# Patient Record
Sex: Female | Born: 1945 | Race: White | Hispanic: No | Marital: Single | Smoking: Former smoker
Health system: Southern US, Community
[De-identification: ages and names within clinical notes are randomized; demographics above are authoritative.]

## PROBLEM LIST (undated history)

## (undated) DIAGNOSIS — E785 Hyperlipidemia, unspecified: Secondary | ICD-10-CM

## (undated) DIAGNOSIS — E079 Disorder of thyroid, unspecified: Secondary | ICD-10-CM

## (undated) DIAGNOSIS — K219 Gastro-esophageal reflux disease without esophagitis: Secondary | ICD-10-CM

## (undated) DIAGNOSIS — I10 Essential (primary) hypertension: Secondary | ICD-10-CM

## (undated) DIAGNOSIS — B019 Varicella without complication: Secondary | ICD-10-CM

## (undated) DIAGNOSIS — J40 Bronchitis, not specified as acute or chronic: Secondary | ICD-10-CM

## (undated) DIAGNOSIS — C801 Malignant (primary) neoplasm, unspecified: Secondary | ICD-10-CM

## (undated) HISTORY — DX: Hyperlipidemia, unspecified: E78.5

## (undated) HISTORY — PX: BREAST SURGERY: SHX581

## (undated) HISTORY — DX: Varicella without complication: B01.9

## (undated) HISTORY — DX: Bronchitis, not specified as acute or chronic: J40

## (undated) HISTORY — DX: Gastro-esophageal reflux disease without esophagitis: K21.9

## (undated) HISTORY — DX: Disorder of thyroid, unspecified: E07.9

---

## 1973-06-16 HISTORY — PX: CHOLECYSTECTOMY: SHX55

## 1973-06-16 HISTORY — PX: APPENDECTOMY: SHX54

## 1987-06-17 HISTORY — PX: ABDOMINAL HYSTERECTOMY: SHX81

## 1999-10-24 ENCOUNTER — Encounter: Payer: Self-pay | Admitting: Emergency Medicine

## 1999-10-24 ENCOUNTER — Emergency Department (HOSPITAL_COMMUNITY): Admission: EM | Admit: 1999-10-24 | Discharge: 1999-10-24 | Payer: Self-pay | Admitting: Emergency Medicine

## 2002-01-10 ENCOUNTER — Emergency Department (HOSPITAL_COMMUNITY): Admission: EM | Admit: 2002-01-10 | Discharge: 2002-01-10 | Payer: Self-pay | Admitting: Emergency Medicine

## 2002-07-08 ENCOUNTER — Emergency Department (HOSPITAL_COMMUNITY): Admission: EM | Admit: 2002-07-08 | Discharge: 2002-07-08 | Payer: Self-pay | Admitting: Emergency Medicine

## 2003-06-17 HISTORY — PX: SHOULDER SURGERY: SHX246

## 2004-10-25 ENCOUNTER — Ambulatory Visit (HOSPITAL_COMMUNITY): Admission: RE | Admit: 2004-10-25 | Discharge: 2004-10-25 | Payer: Self-pay | Admitting: Orthopedic Surgery

## 2004-10-25 ENCOUNTER — Ambulatory Visit (HOSPITAL_BASED_OUTPATIENT_CLINIC_OR_DEPARTMENT_OTHER): Admission: RE | Admit: 2004-10-25 | Discharge: 2004-10-25 | Payer: Self-pay | Admitting: Orthopedic Surgery

## 2004-12-13 ENCOUNTER — Emergency Department (HOSPITAL_COMMUNITY): Admission: EM | Admit: 2004-12-13 | Discharge: 2004-12-13 | Payer: Self-pay | Admitting: Emergency Medicine

## 2009-12-18 ENCOUNTER — Emergency Department (HOSPITAL_COMMUNITY): Admission: EM | Admit: 2009-12-18 | Discharge: 2009-12-18 | Payer: Self-pay | Admitting: Emergency Medicine

## 2010-04-22 ENCOUNTER — Emergency Department (HOSPITAL_COMMUNITY): Admission: EM | Admit: 2010-04-22 | Discharge: 2010-04-22 | Payer: Self-pay | Admitting: Emergency Medicine

## 2010-11-01 NOTE — Op Note (Signed)
NAMEVEDIKA, DUMLAO             ACCOUNT NO.:  [id number]   MEDICAL RECORD NO.:  [id number]          PATIENT TYPE:  AMB   LOCATION:  DSC                          FACILITY:  [facility]   PHYSICIAN:  Harvie Junior, M.D.   DATE OF BIRTH:  August 02, 1945   DATE OF PROCEDURE:  10/25/2004  DATE OF DISCHARGE:                                 OPERATIVE REPORT   PREOPERATIVE DIAGNOSES:  1.  Impingement.  2.  Acromioclavicular joint arthritis.   POSTOPERATIVE DIAGNOSES:  1.  Impingement.  2.  Acromioclavicular joint arthritis.  3.  Partial thickness rotator cuff tear undersurface.  4.  Posterior superior labral tear.   OPERATION PERFORMED:  1.  Anterolateral acromioplasty from a lateral and posterior compartment.  2.  Distal clavicle resection from the anterior compartment.  3.  Debridement of partial thickness, undersurface rotator cuff tear and      debridement of posterior superior labral tear within the glenohumeral      joint.   SURGEON:  Harvie Junior, M.D.   ASSISTANT:  Marshia Ly, P.A.   ANESTHESIA:  General.   INDICATIONS FOR PROCEDURE:  Ms. Cheryl Gray is a 65 year old female with a  long history of having left shoulder pain.  She was treated conservatively  for a prolonged period of time with continued complaints of pain and failure  of conservative care.  The patient was ultimately taken to the operating  room for operative arthroscopy.   DESCRIPTION OF PROCEDURE:  The patient was taken to the operating room.  After adequate anesthesia was obtained with general anesthetic, the patient  was placed supine on the operating table in moved into beach chair position.  All bony prominences were well padded.  Attention was then turned to the  left shoulder where routine arthroscopic examination revealed that there was  an obvious partial thickness, rotator cuff tear on the undersurface.  This  was debrided with suction shaver.  This was probed at length.  Did not  appear to be  full thickness of any area.  Following this, attention was  turned to the glenohumeral joint.  The biceps tendon was within normal  limits.  The subscap was within normal limits.  Attention was turned back to  the posterior superior labrum which was debrided.  Following this, attention  was turned back to the rotator cuff to make sure there was no additional  partial thickness injury.  Seeing none, the cannulas were taken out of the  glenohumeral joint into the subacromial space.  Anterolateral acromioplasty  was then performed from both the lateral and posterior compartment and  following this, a distal clavicle resection was performed over 15 mm from  the anterior compartment.  Following this, the rotator cuff was thoroughly  debrided from the superior surface, no evidence of a full thickness tearing.  The thinned area from the undersurface was probed and did not appear to have  any significant full thickness issues.  At that point, the shoulder was  copiously irrigated and suctioned dry.  The arthroscopic portals were closed  with a bandage, sterile compressive dressing was applied.  The  patient was  taken to the recovery room where she was noted to be in satisfactory  condition.   The estimated blood loss for this procedure was none.       JLG/MEDQ  D:  10/25/2004  T:  10/25/2004  Job:  [id number]

## 2012-02-16 ENCOUNTER — Emergency Department (HOSPITAL_BASED_OUTPATIENT_CLINIC_OR_DEPARTMENT_OTHER): Payer: Self-pay

## 2012-02-16 ENCOUNTER — Emergency Department (HOSPITAL_BASED_OUTPATIENT_CLINIC_OR_DEPARTMENT_OTHER)
Admission: EM | Admit: 2012-02-16 | Discharge: 2012-02-16 | Disposition: A | Discharge status: Home / Self Care | Payer: Self-pay | Attending: Emergency Medicine | Admitting: Emergency Medicine

## 2012-02-16 ENCOUNTER — Encounter (HOSPITAL_BASED_OUTPATIENT_CLINIC_OR_DEPARTMENT_OTHER): Payer: Self-pay | Admitting: *Deleted

## 2012-02-16 DIAGNOSIS — I10 Essential (primary) hypertension: Secondary | ICD-10-CM | POA: Insufficient documentation

## 2012-02-16 DIAGNOSIS — W2209XA Striking against other stationary object, initial encounter: Secondary | ICD-10-CM | POA: Insufficient documentation

## 2012-02-16 DIAGNOSIS — S60222A Contusion of left hand, initial encounter: Secondary | ICD-10-CM

## 2012-02-16 DIAGNOSIS — Z885 Allergy status to narcotic agent status: Secondary | ICD-10-CM | POA: Insufficient documentation

## 2012-02-16 DIAGNOSIS — S60229A Contusion of unspecified hand, initial encounter: Secondary | ICD-10-CM | POA: Insufficient documentation

## 2012-02-16 DIAGNOSIS — Z87891 Personal history of nicotine dependence: Secondary | ICD-10-CM | POA: Insufficient documentation

## 2012-02-16 HISTORY — DX: Essential (primary) hypertension: I10

## 2012-02-16 NOTE — ED Notes (Signed)
Hit her left hand on a counter this am. Bruising and swelling noted to his left 5th digit.

## 2012-02-16 NOTE — ED Notes (Signed)
Pt. Is able to move L pinky and ring finger after hitting the L hand on a wood counter today at 11am.  Pt. In no distress and is able to bend hand and fingers on Left.

## 2012-02-16 NOTE — ED Provider Notes (Signed)
History     CSN: [id number]  Arrival date & time 02/16/12  1257   First MD Initiated Contact with Patient 02/16/12 1443      Chief Complaint  Patient presents with  . Hand Pain    (Consider location/radiation/quality/duration/timing/severity/associated sxs/prior treatment) HPI This 66 year old female accidentally hit Her house with her left hand earlier today in the morning and has bruising and swelling to the left fifth metacarpophalangeal region. Skin is intact. She has full range of motion is able to fully extend and make a fist with no weakness or numbness to that hand. There is no pain to the rest of the hand and no pain to the wrist. There is no treatment prior to arrival other than some ice pack at home. She is no other injuries no other concerns. This is not an episode of domestic violence. She is no neck pain back pain chest pain shortness breath abdominal pain or injury to her right arm or legs. Past Medical History  Diagnosis Date  . Hypertension     Past Surgical History  Procedure Date  . Abdominal hysterectomy   . Appendectomy   . Cholecystectomy     No family history on file.  History  Substance Use Topics  . Smoking status: Former Games developer  . Smokeless tobacco: Not on file  . Alcohol Use: No    OB History    Grav Para Term Preterm Abortions TAB SAB Ect Mult Living                  Review of Systems 10 Systems reviewed and are negative for acute change except as noted in the HPI. Allergies  Codeine  Home Medications  No current outpatient prescriptions on file.  BP 203/114  Pulse 85  Temp 98.8 F (37.1 C) (Oral)  Resp 18  SpO2 99%  Physical Exam  Nursing note and vitals reviewed. Constitutional:       Awake, alert, nontoxic appearance.  HENT:  Head: Atraumatic.  Eyes: Right eye exhibits no discharge. Left eye exhibits no discharge.  Neck: Neck supple.  Pulmonary/Chest: Effort normal. She exhibits no tenderness.  Abdominal: Soft. There is  no tenderness. There is no rebound.  Musculoskeletal: She exhibits no tenderness.       Baseline ROM, no obvious new focal weakness. Right arm and both legs nontender. Cervical spine nontender. Left arm is nontender the shoulder upper arm elbow forearm and wrist. Left hand is tender only at the bruised and swollen fifth metacarpal phalangeal region only. She has full active movement of her left hand without difficulty. She is normal light touch and no apparent weakness in her left hand. The rest of her hand is nontender.  Neurological:       Mental status and motor strength appears baseline for patient and situation.  Skin: No rash noted.  Psychiatric: She has a normal mood and affect.    ED Course  Procedures (including critical care time)  Labs Reviewed - No data to display No results found.   1. Contusion of left hand       MDM          Hurman Horn, MD 02/20/12 2227

## 2012-06-20 ENCOUNTER — Emergency Department (HOSPITAL_COMMUNITY)
Admission: EM | Admit: 2012-06-20 | Discharge: 2012-06-20 | Disposition: A | Discharge status: Home / Self Care | Payer: Self-pay | Attending: Emergency Medicine | Admitting: Emergency Medicine

## 2012-06-20 ENCOUNTER — Encounter (HOSPITAL_COMMUNITY): Payer: Self-pay

## 2012-06-20 DIAGNOSIS — R7309 Other abnormal glucose: Secondary | ICD-10-CM | POA: Insufficient documentation

## 2012-06-20 DIAGNOSIS — R112 Nausea with vomiting, unspecified: Secondary | ICD-10-CM | POA: Insufficient documentation

## 2012-06-20 DIAGNOSIS — R6883 Chills (without fever): Secondary | ICD-10-CM | POA: Insufficient documentation

## 2012-06-20 DIAGNOSIS — R739 Hyperglycemia, unspecified: Secondary | ICD-10-CM

## 2012-06-20 DIAGNOSIS — B9789 Other viral agents as the cause of diseases classified elsewhere: Secondary | ICD-10-CM | POA: Insufficient documentation

## 2012-06-20 DIAGNOSIS — R51 Headache: Secondary | ICD-10-CM | POA: Insufficient documentation

## 2012-06-20 DIAGNOSIS — I10 Essential (primary) hypertension: Secondary | ICD-10-CM | POA: Insufficient documentation

## 2012-06-20 DIAGNOSIS — R61 Generalized hyperhidrosis: Secondary | ICD-10-CM | POA: Insufficient documentation

## 2012-06-20 DIAGNOSIS — Z87891 Personal history of nicotine dependence: Secondary | ICD-10-CM | POA: Insufficient documentation

## 2012-06-20 DIAGNOSIS — B349 Viral infection, unspecified: Secondary | ICD-10-CM

## 2012-06-20 DIAGNOSIS — R630 Anorexia: Secondary | ICD-10-CM | POA: Insufficient documentation

## 2012-06-20 LAB — URINALYSIS, ROUTINE W REFLEX MICROSCOPIC
Glucose, UA: NEGATIVE mg/dL
Hgb urine dipstick: NEGATIVE
Ketones, ur: NEGATIVE mg/dL
Leukocytes, UA: NEGATIVE
pH: 5 (ref 5.0–8.0)

## 2012-06-20 LAB — CBC WITH DIFFERENTIAL/PLATELET
Basophils Absolute: 0 10*3/uL (ref 0.0–0.1)
Lymphocytes Relative: 3 % — ABNORMAL LOW (ref 12–46)
Monocytes Relative: 3 % (ref 3–12)
Platelets: 326 10*3/uL (ref 150–400)
RBC: 4.72 MIL/uL (ref 3.87–5.11)
RDW: 12.7 % (ref 11.5–15.5)
WBC: 13 10*3/uL — ABNORMAL HIGH (ref 4.0–10.5)

## 2012-06-20 LAB — COMPREHENSIVE METABOLIC PANEL
ALT: 15 U/L (ref 0–35)
AST: 13 U/L (ref 0–37)
Albumin: 3.8 g/dL (ref 3.5–5.2)
Alkaline Phosphatase: 74 U/L (ref 39–117)
Chloride: 94 mEq/L — ABNORMAL LOW (ref 96–112)
Potassium: 4.3 mEq/L (ref 3.5–5.1)
Sodium: 132 mEq/L — ABNORMAL LOW (ref 135–145)
Total Bilirubin: 0.5 mg/dL (ref 0.3–1.2)

## 2012-06-20 MED ORDER — METOCLOPRAMIDE HCL 5 MG/ML IJ SOLN
10.0000 mg | Freq: Once | INTRAMUSCULAR | Status: AC
  Administered 2012-06-20: 10 mg via INTRAVENOUS
  Filled 2012-06-20: qty 2

## 2012-06-20 MED ORDER — SODIUM CHLORIDE 0.9 % IV BOLUS (SEPSIS)
1000.0000 mL | Freq: Once | INTRAVENOUS | Status: AC
  Administered 2012-06-20: 1000 mL via INTRAVENOUS

## 2012-06-20 MED ORDER — ONDANSETRON HCL 4 MG/2ML IJ SOLN
4.0000 mg | Freq: Once | INTRAMUSCULAR | Status: AC
  Administered 2012-06-20: 4 mg via INTRAVENOUS
  Filled 2012-06-20: qty 2

## 2012-06-20 MED ORDER — LORAZEPAM 1 MG PO TABS
1.0000 mg | ORAL_TABLET | Freq: Once | ORAL | Status: AC
  Administered 2012-06-20: 1 mg via ORAL
  Filled 2012-06-20: qty 1

## 2012-06-20 MED ORDER — ONDANSETRON 8 MG PO TBDP: ORAL_TABLET | ORAL | Status: DC

## 2012-06-20 NOTE — ED Provider Notes (Signed)
Medical screening examination/treatment/procedure(s) were performed by non-physician practitioner and as supervising physician I was immediately available for consultation/collaboration.  Maudell Stanbrough M Mystique Bjelland, MD 06/20/12 0703 

## 2012-06-20 NOTE — ED Notes (Signed)
Per pt started having abdominal pain, n/v, and abdominal bloating today.  Pt vomiting bile in triage.  Pt states small bm this AM.

## 2012-06-20 NOTE — ED Provider Notes (Signed)
History     CSN: [id number]  Arrival date & time 06/20/12  0002   First MD Initiated Contact with Patient 06/20/12 [id number]      Chief Complaint  Patient presents with  . Emesis  . Headache   HPI  History provided by the patient and family. Patient is a 67 year old female with history of hypertension, cholecystectomy, appendectomy who presents with complaints of nausea vomiting. Patient reports feeling slightly ill today but otherwise generally well until about 6 PM when she developed multiple episodes of nausea vomiting. Patient reports continued nausea vomiting especially after attempting to eat and drink. She has some associated upper abdominal cramping which is improved after vomiting. Symptoms have also been associated with some chills and sweats. She denies any fever. Denies any diarrhea constipation symptoms. Denies any abdominal distention. Patient has had some family members with recent diarrhea symptoms. She denies any chest pain, shortness of breath or heart palpitations.    Past Medical History  Diagnosis Date  . Hypertension     Past Surgical History  Procedure Date  . Abdominal hysterectomy   . Appendectomy   . Cholecystectomy     History reviewed. No pertinent family history.  History  Substance Use Topics  . Smoking status: Former Games developer  . Smokeless tobacco: Not on file  . Alcohol Use: No    OB History    Grav Para Term Preterm Abortions TAB SAB Ect Mult Living                  Review of Systems  Constitutional: Positive for chills, diaphoresis and appetite change. Negative for fever.  HENT: Negative for neck pain and neck stiffness.   Respiratory: Negative for cough.   Gastrointestinal: Positive for nausea and vomiting. Negative for abdominal pain, diarrhea and constipation.  Neurological: Positive for headaches.  All other systems reviewed and are negative.    Allergies  Codeine  Home Medications  No current outpatient prescriptions on  file.  BP 130/82  Pulse 114  Temp 98.4 F (36.9 C) (Oral)  Resp 16  SpO2 97%  Physical Exam  Nursing note and vitals reviewed. Constitutional: She is oriented to person, place, and time. She appears well-developed and well-nourished. No distress.  HENT:  Head: Normocephalic.  Cardiovascular: Normal rate and regular rhythm.   Pulmonary/Chest: Effort normal and breath sounds normal. No respiratory distress. She has no wheezes.  Abdominal: She exhibits no distension. There is tenderness. There is no rebound and no guarding.       Mild epigastric tenderness  Neurological: She is alert and oriented to person, place, and time.  Skin: Skin is warm and dry.  Psychiatric: She has a normal mood and affect. Her behavior is normal.    ED Course  Procedures   Results for orders placed during the hospital encounter of 06/20/12  CBC WITH DIFFERENTIAL      Component Value Range   WBC 13.0 (*) 4.0 - 10.5 K/uL   RBC 4.72  3.87 - 5.11 MIL/uL   Hemoglobin 14.8  12.0 - 15.0 g/dL   HCT 11.9  14.7 - 82.9 %   MCV 90.5  78.0 - 100.0 fL   MCH 31.4  26.0 - 34.0 pg   MCHC 34.7  30.0 - 36.0 g/dL   RDW 56.2  13.0 - 86.5 %   Platelets 326  150 - 400 K/uL   Neutrophils Relative 93 (*) 43 - 77 %   Lymphocytes Relative 3 (*) 12 - 46 %  Monocytes Relative 3  3 - 12 %   Eosinophils Relative 1  0 - 5 %   Basophils Relative 0  0 - 1 %   Neutro Abs 12.1 (*) 1.7 - 7.7 K/uL   Lymphs Abs 0.4 (*) 0.7 - 4.0 K/uL   Monocytes Absolute 0.4  0.1 - 1.0 K/uL   Eosinophils Absolute 0.1  0.0 - 0.7 K/uL   Basophils Absolute 0.0  0.0 - 0.1 K/uL   WBC Morphology VACUOLATED NEUTROPHILS    COMPREHENSIVE METABOLIC PANEL      Component Value Range   Sodium 132 (*) 135 - 145 mEq/L   Potassium 4.3  3.5 - 5.1 mEq/L   Chloride 94 (*) 96 - 112 mEq/L   CO2 23  19 - 32 mEq/L   Glucose, Bld 240 (*) 70 - 99 mg/dL   BUN 23  6 - 23 mg/dL   Creatinine, Ser 1.61  0.50 - 1.10 mg/dL   Calcium 9.2  8.4 - 09.6 mg/dL   Total Protein  8.0  6.0 - 8.3 g/dL   Albumin 3.8  3.5 - 5.2 g/dL   AST 13  0 - 37 U/L   ALT 15  0 - 35 U/L   Alkaline Phosphatase 74  39 - 117 U/L   Total Bilirubin 0.5  0.3 - 1.2 mg/dL   GFR calc non Af Amer 90 (*) >90 mL/min   GFR calc Af Amer >90  >90 mL/min  LIPASE, BLOOD      Component Value Range   Lipase 20  11 - 59 U/L  URINALYSIS, ROUTINE W REFLEX MICROSCOPIC      Component Value Range   Color, Urine AMBER (*) YELLOW   APPearance CLOUDY (*) CLEAR   Specific Gravity, Urine 1.028  1.005 - 1.030   pH 5.0  5.0 - 8.0   Glucose, UA NEGATIVE  NEGATIVE mg/dL   Hgb urine dipstick NEGATIVE  NEGATIVE   Bilirubin Urine SMALL (*) NEGATIVE   Ketones, ur NEGATIVE  NEGATIVE mg/dL   Protein, ur NEGATIVE  NEGATIVE mg/dL   Urobilinogen, UA 0.2  0.0 - 1.0 mg/dL   Nitrite NEGATIVE  NEGATIVE   Leukocytes, UA NEGATIVE  NEGATIVE        1. Nausea & vomiting   2. Viral infection   3. Hyperglycemia       MDM  1:00 AM patient seen and evaluated. Patient sitting comfortably complaining of some nausea. Does not appear in any acute distress.  Patient feeling much better after IV fluids and medications. She is tolerating by mouth fluids. Abdomen remains soft and benign without significant tenderness or peritoneal signs. Patient requesting to return home. We'll provide prescription for Zofran.      Angus Seller, PA 06/20/12 0600

## 2013-01-24 ENCOUNTER — Other Ambulatory Visit (INDEPENDENT_AMBULATORY_CARE_PROVIDER_SITE_OTHER): Payer: Medicare Other

## 2013-01-24 ENCOUNTER — Ambulatory Visit (INDEPENDENT_AMBULATORY_CARE_PROVIDER_SITE_OTHER): Payer: Medicare Other | Admitting: Internal Medicine

## 2013-01-24 ENCOUNTER — Encounter: Payer: Self-pay | Admitting: Internal Medicine

## 2013-01-24 VITALS — BP 170/100 | HR 75 | Temp 97.5°F | Ht 60.0 in | Wt 199.0 lb

## 2013-01-24 DIAGNOSIS — E079 Disorder of thyroid, unspecified: Secondary | ICD-10-CM

## 2013-01-24 DIAGNOSIS — R7309 Other abnormal glucose: Secondary | ICD-10-CM

## 2013-01-24 DIAGNOSIS — I10 Essential (primary) hypertension: Secondary | ICD-10-CM

## 2013-01-24 DIAGNOSIS — Z23 Encounter for immunization: Secondary | ICD-10-CM

## 2013-01-24 DIAGNOSIS — E669 Obesity, unspecified: Secondary | ICD-10-CM | POA: Insufficient documentation

## 2013-01-24 DIAGNOSIS — K219 Gastro-esophageal reflux disease without esophagitis: Secondary | ICD-10-CM

## 2013-01-24 DIAGNOSIS — E785 Hyperlipidemia, unspecified: Secondary | ICD-10-CM

## 2013-01-24 DIAGNOSIS — R739 Hyperglycemia, unspecified: Secondary | ICD-10-CM

## 2013-01-24 LAB — COMPREHENSIVE METABOLIC PANEL
AST: 15 U/L (ref 0–37)
BUN: 15 mg/dL (ref 6–23)
Calcium: 9.8 mg/dL (ref 8.4–10.5)
Chloride: 101 mEq/L (ref 96–112)
Creatinine, Ser: 0.8 mg/dL (ref 0.4–1.2)
GFR: 81.16 mL/min (ref >60.00)

## 2013-01-24 LAB — LIPID PANEL
HDL: 54.3 mg/dL (ref >39.00)
Triglycerides: 131 mg/dL (ref 0.0–149.0)

## 2013-01-24 LAB — CBC
Hemoglobin: 14.4 g/dL (ref 12.0–15.0)
MCHC: 33.4 g/dL (ref 30.0–36.0)
Platelets: 323 10*3/uL (ref 150.0–400.0)

## 2013-01-24 LAB — TSH: TSH: 4.39 u[IU]/mL (ref 0.35–5.50)

## 2013-01-24 MED ORDER — PNEUMOCOCCAL VAC POLYVALENT 25 MCG/0.5ML IJ INJ
0.5000 mL | INJECTION | Freq: Once | INTRAMUSCULAR | Status: AC
  Administered 2013-01-24: 0.5 mL via INTRAMUSCULAR

## 2013-01-24 MED ORDER — LISINOPRIL-HYDROCHLOROTHIAZIDE 20-12.5 MG PO TABS: 1.0000 | ORAL_TABLET | Freq: Every day | ORAL | Status: DC

## 2013-01-24 MED ORDER — PANTOPRAZOLE SODIUM 40 MG PO TBEC: 40.0000 mg | DELAYED_RELEASE_TABLET | Freq: Every day | ORAL | Status: DC

## 2013-01-24 MED ORDER — TETANUS-DIPHTHERIA TOXOIDS TD 5-2 LFU IM INJ
0.5000 mL | INJECTION | Freq: Once | INTRAMUSCULAR | Status: AC
  Administered 2013-01-24: 0.5 mL via INTRAMUSCULAR

## 2013-01-24 NOTE — Progress Notes (Signed)
HPI Pt presents to the clinic today to establish care. She is transferring care from [facility]. She has no concerns today.  Flu: 2013 Tetanus: 2004 Pneumovax: never Zostavax: never Colonoscopy: around the age of 89 Pap smear: total hysterectomy Mammogram: more than 5 years ago Bone Density: never Eye doctor: as needed Dentist: as needed  Past Medical History  Diagnosis Date  . Hypertension   . Chicken pox   . Bronchitis   . GERD (gastroesophageal reflux disease)   . Thyroid condition   . Hyperlipidemia     No current outpatient prescriptions on file.   No current facility-administered medications for this visit.    Allergies  Allergen Reactions  . Codeine     hallucinations.    Family History  Problem Relation Age of Onset  . Hyperlipidemia Mother   . Hypertension Mother   . Cancer Father     lung  . Hyperlipidemia Father   . Hypertension Father     History   Social History  . Marital Status: Single    Spouse Name: N/A    Number of Children: 3   . Years of Education: 9   Occupational History  . Retired    Social History Main Topics  . Smoking status: Former Games developer  . Smokeless tobacco: Not on file  . Alcohol Use: No  . Drug Use: No  . Sexually Active: Not Currently   Other Topics Concern  . Not on file   Social History Narrative  . No narrative on file    ROS:  Constitutional: Denies fever, malaise, fatigue, headache or abrupt weight changes.  HEENT: Denies eye pain, eye redness, ear pain, ringing in the ears, wax buildup, runny nose, nasal congestion, bloody nose, or sore throat. Respiratory: Denies difficulty breathing, shortness of breath, cough or sputum production.   Cardiovascular: Denies chest pain, chest tightness, palpitations or swelling in the hands or feet.  Gastrointestinal: Denies abdominal pain, bloating, constipation, diarrhea or blood in the stool.  GU: Denies frequency, urgency, pain with urination, blood in urine,  odor or discharge. Musculoskeletal: Denies decrease in range of motion, difficulty with gait, muscle pain or joint pain and swelling.  Skin: Denies redness, rashes, lesions or ulcercations.  Neurological: Denies dizziness, difficulty with memory, difficulty with speech or problems with balance and coordination.   No other specific complaints in a complete review of systems (except as listed in HPI above).  PE:  BP 170/100  Pulse 75  Temp(Src) 97.5 F (36.4 C) (Oral)  Ht 5' (1.524 m)  Wt 199 lb (90.266 kg)  BMI 38.86 kg/m2  SpO2 97%  LMP 06/17/1987 Wt Readings from Last 3 Encounters:  01/24/13 199 lb (90.266 kg)    General: Appears her stated age, obese but well developed, well nourished in NAD. HEENT: Head: normal shape and size; Eyes: sclera white, no icterus, conjunctiva pink, PERRLA and EOMs intact; Ears: Tm's gray and intact, normal light reflex; Nose: mucosa pink and moist, septum midline; Throat/Mouth: Teeth present, mucosa pink and moist, no lesions or ulcerations noted.  Neck: Normal range of motion. Neck supple, trachea midline. No massses, lumps or thyromegaly present.  Cardiovascular: Normal rate and rhythm. S1,S2 noted.  No murmur, rubs or gallops noted. No JVD or BLE edema. No carotid bruits noted. Pulmonary/Chest: Normal effort and positive vesicular breath sounds. No respiratory distress. No wheezes, rales or ronchi noted.  Abdomen: Soft and nontender. Normal bowel sounds, no bruits noted. No distention or masses noted. Liver, spleen and  kidneys non palpable. Musculoskeletal: Normal range of motion. No signs of joint swelling. No difficulty with gait.  Neurological: Alert and oriented. Cranial nerves II-XII intact. Coordination normal. +DTRs bilaterally. Psychiatric: Mood and affect normal. Behavior is normal. Judgment and thought content normal.    BMET    Component Value Date/Time   NA 132* 06/20/2012 0024   K 4.3 06/20/2012 0024   CL 94* 06/20/2012 0024   CO2 23  06/20/2012 0024   GLUCOSE 240* 06/20/2012 0024   BUN 23 06/20/2012 0024   CREATININE 0.70 06/20/2012 0024   CALCIUM 9.2 06/20/2012 0024   GFRNONAA 90* 06/20/2012 0024   GFRAA >90 06/20/2012 0024    Lipid Panel  No results found for this basename: chol, trig, hdl, cholhdl, vldl, ldlcalc    CBC    Component Value Date/Time   WBC 13.0* 06/20/2012 0024   RBC 4.72 06/20/2012 0024   HGB 14.8 06/20/2012 0024   HCT 42.7 06/20/2012 0024   PLT 326 06/20/2012 0024   MCV 90.5 06/20/2012 0024   MCH 31.4 06/20/2012 0024   MCHC 34.7 06/20/2012 0024   RDW 12.7 06/20/2012 0024   LYMPHSABS 0.4* 06/20/2012 0024   MONOABS 0.4 06/20/2012 0024   EOSABS 0.1 06/20/2012 0024   BASOSABS 0.0 06/20/2012 0024    Hgb A1C No results found for this basename: HGBA1C     Assessment and Plan:  Prevent health:  Work on diet and exercise Will give TD and pneumovax today Pt would like to hold off on Zostovax, colonoscopy and mammogram Encouraged pt to visit eye doctor and dentist at least yearly

## 2013-01-24 NOTE — Addendum Note (Signed)
Addended by: Marlene Lard on: 01/24/2013 03:28 PM   Modules accepted: Orders

## 2013-01-24 NOTE — Assessment & Plan Note (Signed)
Will recheck lipids today 

## 2013-01-24 NOTE — Assessment & Plan Note (Signed)
Will recheck tsh today. 

## 2013-01-24 NOTE — Assessment & Plan Note (Signed)
Elevated but been out of meds for 6 months Refilled today Low sodium diet  RTC in 1 month to recheck

## 2013-01-24 NOTE — Patient Instructions (Signed)

## 2013-01-24 NOTE — Assessment & Plan Note (Signed)
Will start protonix Tums as needed

## 2013-01-24 NOTE — Assessment & Plan Note (Signed)
Will check A1C.  

## 2013-01-24 NOTE — Assessment & Plan Note (Signed)
Maintain a 1400 calorie diet Start aerobic exercise 3 days per week

## 2013-01-25 ENCOUNTER — Other Ambulatory Visit: Payer: Self-pay | Admitting: Internal Medicine

## 2013-01-25 MED ORDER — SIMVASTATIN 10 MG PO TABS: 10.0000 mg | ORAL_TABLET | Freq: Every day | ORAL | Status: DC

## 2013-03-02 ENCOUNTER — Ambulatory Visit: Payer: Medicare Other | Admitting: Internal Medicine

## 2013-03-03 ENCOUNTER — Ambulatory Visit (INDEPENDENT_AMBULATORY_CARE_PROVIDER_SITE_OTHER): Payer: Medicare Other | Admitting: Internal Medicine

## 2013-03-03 ENCOUNTER — Encounter: Payer: Self-pay | Admitting: Internal Medicine

## 2013-03-03 VITALS — BP 134/86 | HR 66 | Temp 98.4°F | Wt 201.2 lb

## 2013-03-03 DIAGNOSIS — I10 Essential (primary) hypertension: Secondary | ICD-10-CM

## 2013-03-03 DIAGNOSIS — Z23 Encounter for immunization: Secondary | ICD-10-CM

## 2013-03-03 MED ORDER — LISINOPRIL-HYDROCHLOROTHIAZIDE 20-12.5 MG PO TABS: 1.0000 | ORAL_TABLET | Freq: Every day | ORAL | Status: DC

## 2013-03-03 NOTE — Assessment & Plan Note (Signed)
Well controlled on current regimen Meds refilled for 6  months

## 2013-03-03 NOTE — Progress Notes (Signed)
Subjective:    Patient ID: Cheryl Gray, female    DOB: 08/11/1945, 67 y.o.   MRN: [id number]  HPI  Pt presents to the clinic today for 1 month follow up of HTN. At the last visit her BP was elevated but she had been out of her meds for some time. Her medication was restarted. She reports good compliance with medications. She tries to consume as little salt as possible in her diet. Se does record her BP's daily which range from 120/79-140/85.  Review of Systems      Past Medical History  Diagnosis Date  . Hypertension   . Chicken pox   . Bronchitis   . GERD (gastroesophageal reflux disease)   . Thyroid condition   . Hyperlipidemia     Current Outpatient Prescriptions  Medication Sig Dispense Refill  . lisinopril-hydrochlorothiazide (PRINZIDE,ZESTORETIC) 20-12.5 MG per tablet Take 1 tablet by mouth daily.  30 tablet  0  . pantoprazole (PROTONIX) 40 MG tablet Take 1 tablet (40 mg total) by mouth daily.  30 tablet  3  . simvastatin (ZOCOR) 10 MG tablet Take 1 tablet (10 mg total) by mouth at bedtime.  90 tablet  3   No current facility-administered medications for this visit.    Allergies  Allergen Reactions  . Codeine     hallucinations.    Family History  Problem Relation Age of Onset  . Hyperlipidemia Mother   . Hypertension Mother   . Cancer Father     lung  . Hyperlipidemia Father   . Hypertension Father     History   Social History  . Marital Status: Single    Spouse Name: N/A    Number of Children: 3   . Years of Education: 9   Occupational History  . Retired    Social History Main Topics  . Smoking status: Former Games developer  . Smokeless tobacco: Not on file  . Alcohol Use: No  . Drug Use: No  . Sexual Activity: Not Currently   Other Topics Concern  . Not on file   Social History Narrative  . No narrative on file     Constitutional: Denies fever, malaise, fatigue, headache or abrupt weight changes.  Cardiovascular: Denies chest pain,  chest tightness, palpitations or swelling in the hands or feet.  Neurological: Denies dizziness, difficulty with memory, difficulty with speech or problems with balance and coordination.   No other specific complaints in a complete review of systems (except as listed in HPI above).  Objective:   Physical Exam   BP 134/86  Pulse 66  Temp(Src) 98.4 F (36.9 C) (Oral)  Wt 201 lb 4 oz (91.286 kg)  BMI 39.3 kg/m2  SpO2 98%  LMP 06/17/1987 Wt Readings from Last 3 Encounters:  03/03/13 201 lb 4 oz (91.286 kg)  01/24/13 199 lb (90.266 kg)    General: Appears her stated age, obese but well developed, well nourished in NAD.Marland Kitchen  Cardiovascular: Normal rate and rhythm. S1,S2 noted.  No murmur, rubs or gallops noted. No JVD or BLE edema. No carotid bruits noted. Pulmonary/Chest: Normal effort and positive vesicular breath sounds. No respiratory distress. No wheezes, rales or ronchi noted.  Neurological: Alert and oriented. Cranial nerves II-XII intact. Coordination normal. +DTRs bilaterally.   BMET    Component Value Date/Time   NA 139 01/24/2013 1519   K 4.3 01/24/2013 1519   CL 101 01/24/2013 1519   CO2 30 01/24/2013 1519   GLUCOSE 93 01/24/2013 1519   BUN  15 01/24/2013 1519   CREATININE 0.8 01/24/2013 1519   CALCIUM 9.8 01/24/2013 1519   GFRNONAA 90* 06/20/2012 0024   GFRAA >90 06/20/2012 0024    Lipid Panel     Component Value Date/Time   CHOL 214* 01/24/2013 1519   TRIG 131.0 01/24/2013 1519   HDL 54.30 01/24/2013 1519   CHOLHDL 4 01/24/2013 1519   VLDL 26.2 01/24/2013 1519    CBC    Component Value Date/Time   WBC 6.9 01/24/2013 1519   RBC 4.63 01/24/2013 1519   HGB 14.4 01/24/2013 1519   HCT 43.2 01/24/2013 1519   PLT 323.0 01/24/2013 1519   MCV 93.2 01/24/2013 1519   MCH 31.4 06/20/2012 0024   MCHC 33.4 01/24/2013 1519   RDW 12.9 01/24/2013 1519   LYMPHSABS 0.4* 06/20/2012 0024   MONOABS 0.4 06/20/2012 0024   EOSABS 0.1 06/20/2012 0024   BASOSABS 0.0 06/20/2012 0024    Hgb A1C Lab  Results  Component Value Date   HGBA1C 6.0 01/24/2013        Assessment & Plan:   Flu shot today!

## 2013-03-03 NOTE — Patient Instructions (Signed)

## 2013-05-06 ENCOUNTER — Ambulatory Visit: Payer: Medicare Other | Admitting: Nurse Practitioner

## 2013-05-06 ENCOUNTER — Ambulatory Visit: Payer: Medicare Other | Admitting: Internal Medicine

## 2013-05-27 ENCOUNTER — Ambulatory Visit (INDEPENDENT_AMBULATORY_CARE_PROVIDER_SITE_OTHER): Payer: Medicare Other | Admitting: Nurse Practitioner

## 2013-05-27 ENCOUNTER — Other Ambulatory Visit (INDEPENDENT_AMBULATORY_CARE_PROVIDER_SITE_OTHER): Payer: Medicare Other

## 2013-05-27 ENCOUNTER — Encounter: Payer: Self-pay | Admitting: Nurse Practitioner

## 2013-05-27 VITALS — BP 120/78 | HR 81 | Temp 98.4°F | Wt 200.0 lb

## 2013-05-27 DIAGNOSIS — K219 Gastro-esophageal reflux disease without esophagitis: Secondary | ICD-10-CM

## 2013-05-27 DIAGNOSIS — I10 Essential (primary) hypertension: Secondary | ICD-10-CM

## 2013-05-27 DIAGNOSIS — J209 Acute bronchitis, unspecified: Secondary | ICD-10-CM

## 2013-05-27 DIAGNOSIS — E785 Hyperlipidemia, unspecified: Secondary | ICD-10-CM

## 2013-05-27 LAB — COMPREHENSIVE METABOLIC PANEL
Albumin: 3.9 g/dL (ref 3.5–5.2)
BUN: 13 mg/dL (ref 6–23)
CO2: 30 mEq/L (ref 19–32)
Calcium: 9.2 mg/dL (ref 8.4–10.5)
Chloride: 98 mEq/L (ref 96–112)
Glucose, Bld: 107 mg/dL — ABNORMAL HIGH (ref 70–99)
Potassium: 4.1 mEq/L (ref 3.5–5.1)

## 2013-05-27 LAB — LIPID PANEL
Cholesterol: 163 mg/dL (ref 0–200)
Triglycerides: 57 mg/dL (ref 0.0–149.0)

## 2013-05-27 MED ORDER — PANTOPRAZOLE SODIUM 20 MG PO TBEC: 20.0000 mg | DELAYED_RELEASE_TABLET | Freq: Every day | ORAL | Status: DC

## 2013-05-27 MED ORDER — BENZONATATE 100 MG PO CAPS: ORAL_CAPSULE | ORAL | Status: DC

## 2013-05-27 NOTE — Progress Notes (Signed)
Pre-visit discussion using our clinic review tool. No additional management support is needed unless otherwise documented below in the visit note.  

## 2013-05-27 NOTE — Progress Notes (Signed)
Subjective:     Cheryl Gray is a 67 y.o. female and is here for evaluation of cough and congestion and also follow up for dyslipidemia and hypertension. Regarding acute symptoms, she developed sore throat, cough & nasal congestion 5 days ago. She c/o tight feeling in chest, bloody nasal drainage, fatigue. Denies fever, sore throat, HA, body aches. Regarding hypertension & dylipidemia, she was started on lisinopril/HCTZ & zocor respectively, 3 mos ago.  She reports compliance with medication.  History   Social History  . Marital Status: Single    Spouse Name: N/A    Number of Children: 3   . Years of Education: 9   Occupational History  . Retired    Social History Main Topics  . Smoking status: Former Games developer  . Smokeless tobacco: Not on file  . Alcohol Use: No  . Drug Use: No  . Sexual Activity: Not Currently   Other Topics Concern  . Not on file   Social History Narrative  . No narrative on file   Health Maintenance  Topic Date Due  . Mammogram  12/26/1997  . Colonoscopy  12/26/1997  . Zostavax  12/27/2007  . Influenza Vaccine  01/14/2014  . Tetanus/tdap  01/25/2023  . Pneumococcal Polysaccharide Vaccine Age 94 And Over  Completed    The following portions of the patient's history were reviewed and updated as appropriate: allergies, current medications, past medical history, past surgical history and problem list.  Review of Systems Constitutional: negative for fevers, night sweats and positive for chills & fatigue Ears, nose, mouth, throat, and face: positive for nasal congestion and R ear pain Respiratory: positive for cough and sputum, negative for dyspnea on exertion and wheezing Cardiovascular: negative for chest pressure/discomfort, irregular heart beat, lower extremity edema and near-syncope Gastrointestinal: negative for abdominal pain, change in bowel habits, diarrhea, dyspepsia, nausea and has been taking 40 mg omeprazole for 3 mos w/complete relief of  reflux symptoms   Objective:    BP 120/78  Pulse 81  Temp(Src) 98.4 F (36.9 C) (Oral)  Wt 200 lb (90.719 kg)  SpO2 97%  LMP 06/17/1987 General appearance: alert, cooperative, appears stated age, mild distress and looks tired Head: Normocephalic, without obvious abnormality, atraumatic Eyes: negative findings: lids and lashes normal and conjunctivae and sclerae normal Ears: normal TM's and external ear canals both ears Throat: lips, mucosa, and tongue normal; teeth and gums normal Lungs: clear to auscultation bilaterally Heart: regular rate and rhythm, S1, S2 normal, no murmur, click, rub or gallop Lymph nodes: Cervical, supraclavicular, and axillary nodes normal.    Assessment:   1 acute bronchitis & upper resp illness 2hypertension, lisinopril/HCTZ. Good control in office. 3 dyslipidemia, taking zocor X 3 mos. No reported side effects of med.  4 GERD, complete symptom relief on 40 mg omeprazole Plan:    1 See pt instructions.   2 labs as ordered, continue meds 3 labs, continue meds. 30 minute walk daily 4 decrease dosage to 20 mg daily. F/u 3 mos.   See After Visit Summary for Counseling Recommendations

## 2013-05-27 NOTE — Patient Instructions (Addendum)
You have a virus causing your symptoms. The average duration of cold symptoms is 14 days. Start daily sinus rinses (neilmed Sinus Rinse). Sip fluids every hour. Rest. Take benzonatate tablets for cough. If you are not feeling better in 1 week or develop fever or chest pain, call us for re-evaluation. Our office will call you regarding lab results. Feel better!  Upper Respiratory Infection, Adult An upper respiratory infection (URI) is also sometimes known as the common cold. The upper respiratory tract includes the nose, sinuses, throat, trachea, and bronchi. Bronchi are the airways leading to the lungs. Most people improve within 1 week, but symptoms can last up to 2 weeks. A residual cough may last even longer.  CAUSES Many different viruses can infect the tissues lining the upper respiratory tract. The tissues become irritated and inflamed and often become very moist. Mucus production is also common. A cold is contagious. You can easily spread the virus to others by oral contact. This includes kissing, sharing a glass, coughing, or sneezing. Touching your mouth or nose and then touching a surface, which is then touched by another person, can also spread the virus. SYMPTOMS  Symptoms typically develop 1 to 3 days after you come in contact with a cold virus. Symptoms vary from person to person. They may include:  Runny nose.  Sneezing.  Nasal congestion.  Sinus irritation.  Sore throat.  Loss of voice (laryngitis).  Cough.  Fatigue.  Muscle aches.  Loss of appetite.  Headache.  Low-grade fever. DIAGNOSIS  You might diagnose your own cold based on familiar symptoms, since most people get a cold 2 to 3 times a year. Your caregiver can confirm this based on your exam. Most importantly, your caregiver can check that your symptoms are not due to another disease such as strep throat, sinusitis, pneumonia, asthma, or epiglottitis. Blood tests, throat tests, and X-rays are not necessary to  diagnose a common cold, but they may sometimes be helpful in excluding other more serious diseases. Your caregiver will decide if any further tests are required. RISKS AND COMPLICATIONS  You may be at risk for a more severe case of the common cold if you smoke cigarettes, have chronic heart disease (such as heart failure) or lung disease (such as asthma), or if you have a weakened immune system. The very young and very old are also at risk for more serious infections. Bacterial sinusitis, middle ear infections, and bacterial pneumonia can complicate the common cold. The common cold can worsen asthma and chronic obstructive pulmonary disease (COPD). Sometimes, these complications can require emergency medical care and may be life-threatening. PREVENTION  The best way to protect against getting a cold is to practice good hygiene. Avoid oral or hand contact with people with cold symptoms. Wash your hands often if contact occurs. There is no clear evidence that vitamin C, vitamin E, echinacea, or exercise reduces the chance of developing a cold. However, it is always recommended to get plenty of rest and practice good nutrition. TREATMENT  Treatment is directed at relieving symptoms. There is no cure. Antibiotics are not effective, because the infection is caused by a virus, not by bacteria. Treatment may include:  Increased fluid intake. Sports drinks offer valuable electrolytes, sugars, and fluids.  Breathing heated mist or steam (vaporizer or shower).  Eating chicken soup or other clear broths, and maintaining good nutrition.  Getting plenty of rest.  Using gargles or lozenges for comfort.  Controlling fevers with ibuprofen or acetaminophen as directed by  your caregiver.  Increasing usage of your inhaler if you have asthma. Zinc gel and zinc lozenges, taken in the first 24 hours of the common cold, can shorten the duration and lessen the severity of symptoms. Pain medicines may help with fever,  muscle aches, and throat pain. A variety of non-prescription medicines are available to treat congestion and runny nose. Your caregiver can make recommendations and may suggest nasal or lung inhalers for other symptoms.  HOME CARE INSTRUCTIONS   Only take over-the-counter or prescription medicines for pain, discomfort, or fever as directed by your caregiver.  Use a warm mist humidifier or inhale steam from a shower to increase air moisture. This may keep secretions moist and make it easier to breathe.  Drink enough water and fluids to keep your urine clear or pale yellow.  Rest as needed.  Return to work when your temperature has returned to normal or as your caregiver advises. You may need to stay home longer to avoid infecting others. You can also use a face mask and careful hand washing to prevent spread of the virus. SEEK MEDICAL CARE IF:   After the first few days, you feel you are getting worse rather than better.  You need your caregiver's advice about medicines to control symptoms.  You develop chills, worsening shortness of breath, or brown or red sputum. These may be signs of pneumonia.  You develop yellow or brown nasal discharge or pain in the face, especially when you bend forward. These may be signs of sinusitis.  You develop a fever, swollen neck glands, pain with swallowing, or white areas in the back of your throat. These may be signs of strep throat. SEEK IMMEDIATE MEDICAL CARE IF:   You have a fever.  You develop severe or persistent headache, ear pain, sinus pain, or chest pain.  You develop wheezing, a prolonged cough, cough up blood, or have a change in your usual mucus (if you have chronic lung disease).  You develop sore muscles or a stiff neck. Document Released: 11/26/2000 Document Revised: 08/25/2011 Document Reviewed: 10/04/2010 [facility] Patient Information 2014 [place], [place].  Bronchitis Bronchitis is the body's way of reacting to injury and/or  infection (inflammation) of the bronchi. Bronchi are the air tubes that extend from the windpipe into the lungs. If the inflammation becomes severe, it may cause shortness of breath. CAUSES  Inflammation may be caused by:  A virus.  Germs (bacteria).  Dust.  Allergens.  Pollutants and many other irritants. The cells lining the bronchial tree are covered with tiny hairs (cilia). These constantly beat upward, away from the lungs, toward the mouth. This keeps the lungs free of pollutants. When these cells become too irritated and are unable to do their job, mucus begins to develop. This causes the characteristic cough of bronchitis. The cough clears the lungs when the cilia are unable to do their job. Without either of these protective mechanisms, the mucus would settle in the lungs. Then you would develop pneumonia. Smoking is a common cause of bronchitis and can contribute to pneumonia. Stopping this habit is the single most important thing you can do to help yourself. TREATMENT   Your caregiver may prescribe an antibiotic if the cough is caused by bacteria. Also, medicines that open up your airways make it easier to breathe. Your caregiver may also recommend or prescribe an expectorant. It will loosen the mucus to be coughed up. Only take over-the-counter or prescription medicines for pain, discomfort, or fever as directed by  your caregiver.  Removing whatever causes the problem (smoking, for example) is critical to preventing the problem from getting worse.  Cough suppressants may be prescribed for relief of cough symptoms.  Inhaled medicines may be prescribed to help with symptoms now and to help prevent problems from returning.  For those with recurrent (chronic) bronchitis, there may be a need for steroid medicines. SEEK IMMEDIATE MEDICAL CARE IF:   During treatment, you develop more pus-like mucus (purulent sputum).  You have a fever.  You become progressively more ill.  You  have increased difficulty breathing, wheezing, or shortness of breath. It is necessary to seek immediate medical care if you are elderly or sick from any other disease. MAKE SURE YOU:   Understand these instructions.  Will watch your condition.  Will get help right away if you are not doing well or get worse. Document Released: 06/02/2005 Document Revised: 02/02/2013 Document Reviewed: 01/25/2013 [facility] Patient Information 2014 [place], [place].

## 2013-08-25 ENCOUNTER — Other Ambulatory Visit: Payer: Self-pay

## 2013-08-25 DIAGNOSIS — I10 Essential (primary) hypertension: Secondary | ICD-10-CM

## 2013-08-25 MED ORDER — LISINOPRIL-HYDROCHLOROTHIAZIDE 20-12.5 MG PO TABS: 1.0000 | ORAL_TABLET | Freq: Every day | ORAL | Status: DC

## 2013-08-25 NOTE — Telephone Encounter (Signed)
Pls check with your ofc manager to find out who is filling meds for [facility] patients.

## 2013-08-25 NOTE — Telephone Encounter (Signed)
Patient had an office visit with you on 05/27/13--i do not see where you have ever filled this medication, is this okay to refill?--please advise

## 2013-08-25 NOTE — Addendum Note (Signed)
Addended by: Lurlean Nanny on: 08/25/2013 11:36 AM   Modules accepted: Orders

## 2013-08-26 ENCOUNTER — Telehealth: Payer: Self-pay | Admitting: Internal Medicine

## 2013-08-26 NOTE — Telephone Encounter (Signed)
Relevant patient education mailed to patient.  

## 2013-10-03 ENCOUNTER — Other Ambulatory Visit: Payer: Self-pay

## 2013-10-03 DIAGNOSIS — K219 Gastro-esophageal reflux disease without esophagitis: Secondary | ICD-10-CM

## 2013-10-03 MED ORDER — PANTOPRAZOLE SODIUM 20 MG PO TBEC: 20.0000 mg | DELAYED_RELEASE_TABLET | Freq: Every day | ORAL | Status: DC

## 2015-06-03 ENCOUNTER — Encounter (HOSPITAL_COMMUNITY): Payer: Self-pay | Admitting: Emergency Medicine

## 2015-06-03 ENCOUNTER — Emergency Department (HOSPITAL_COMMUNITY): Payer: Medicare Other

## 2015-06-03 ENCOUNTER — Observation Stay (HOSPITAL_COMMUNITY)
Admission: EM | Admit: 2015-06-03 | Discharge: 2015-06-05 | Disposition: A | Discharge status: Home / Self Care | Payer: Medicare Other | Attending: Student in an Organized Health Care Education/Training Program | Admitting: Student in an Organized Health Care Education/Training Program

## 2015-06-03 DIAGNOSIS — E785 Hyperlipidemia, unspecified: Secondary | ICD-10-CM | POA: Diagnosis not present

## 2015-06-03 DIAGNOSIS — I4891 Unspecified atrial fibrillation: Secondary | ICD-10-CM | POA: Diagnosis not present

## 2015-06-03 DIAGNOSIS — Z7901 Long term (current) use of anticoagulants: Secondary | ICD-10-CM | POA: Diagnosis not present

## 2015-06-03 DIAGNOSIS — Z853 Personal history of malignant neoplasm of breast: Secondary | ICD-10-CM | POA: Diagnosis not present

## 2015-06-03 DIAGNOSIS — Z9011 Acquired absence of right breast and nipple: Secondary | ICD-10-CM | POA: Diagnosis not present

## 2015-06-03 DIAGNOSIS — E7800 Pure hypercholesterolemia, unspecified: Secondary | ICD-10-CM | POA: Diagnosis not present

## 2015-06-03 DIAGNOSIS — R079 Chest pain, unspecified: Secondary | ICD-10-CM | POA: Diagnosis present

## 2015-06-03 DIAGNOSIS — I10 Essential (primary) hypertension: Secondary | ICD-10-CM | POA: Insufficient documentation

## 2015-06-03 DIAGNOSIS — I480 Paroxysmal atrial fibrillation: Secondary | ICD-10-CM | POA: Insufficient documentation

## 2015-06-03 DIAGNOSIS — K219 Gastro-esophageal reflux disease without esophagitis: Secondary | ICD-10-CM | POA: Insufficient documentation

## 2015-06-03 DIAGNOSIS — Z79899 Other long term (current) drug therapy: Secondary | ICD-10-CM | POA: Insufficient documentation

## 2015-06-03 DIAGNOSIS — Z87891 Personal history of nicotine dependence: Secondary | ICD-10-CM | POA: Diagnosis not present

## 2015-06-03 DIAGNOSIS — R0789 Other chest pain: Secondary | ICD-10-CM | POA: Diagnosis not present

## 2015-06-03 DIAGNOSIS — C50911 Malignant neoplasm of unspecified site of right female breast: Secondary | ICD-10-CM | POA: Diagnosis not present

## 2015-06-03 HISTORY — DX: Malignant (primary) neoplasm, unspecified: C80.1

## 2015-06-03 LAB — BASIC METABOLIC PANEL
Anion gap: 12 (ref 5–15)
BUN: 25 mg/dL — ABNORMAL HIGH (ref 6–20)
CALCIUM: 9.4 mg/dL (ref 8.9–10.3)
CO2: 24 mmol/L (ref 22–32)
CREATININE: 0.9 mg/dL (ref 0.44–1.00)
Chloride: 100 mmol/L — ABNORMAL LOW (ref 101–111)
GFR calc non Af Amer: >60 mL/min (ref >60)
Glucose, Bld: 111 mg/dL — ABNORMAL HIGH (ref 65–99)
Potassium: 4.3 mmol/L (ref 3.5–5.1)
SODIUM: 136 mmol/L (ref 135–145)

## 2015-06-03 LAB — CBC
HCT: 40 % (ref 36.0–46.0)
Hemoglobin: 12.7 g/dL (ref 12.0–15.0)
MCH: 29.3 pg (ref 26.0–34.0)
MCHC: 31.8 g/dL (ref 30.0–36.0)
MCV: 92.4 fL (ref 78.0–100.0)
PLATELETS: 289 10*3/uL (ref 150–400)
RBC: 4.33 MIL/uL (ref 3.87–5.11)
RDW: 12.9 % (ref 11.5–15.5)
WBC: 7.4 10*3/uL (ref 4.0–10.5)

## 2015-06-03 LAB — LIPID PANEL
Cholesterol: 157 mg/dL (ref 0–200)
HDL: 45 mg/dL (ref >40)
LDL Cholesterol: 89 mg/dL (ref 0–99)
Total CHOL/HDL Ratio: 3.5 RATIO
Triglycerides: 113 mg/dL (ref <150)
VLDL: 23 mg/dL (ref 0–40)

## 2015-06-03 LAB — I-STAT TROPONIN, ED
TROPONIN I, POC: 0.01 ng/mL (ref 0.00–0.08)
Troponin i, poc: 0 ng/mL (ref 0.00–0.08)

## 2015-06-03 LAB — TROPONIN I

## 2015-06-03 MED ORDER — SODIUM CHLORIDE 0.9 % IV SOLN: 250.0000 mL | INTRAVENOUS | Status: DC | PRN

## 2015-06-03 MED ORDER — VITAMIN B-12 1000 MCG PO TABS
1000.0000 ug | ORAL_TABLET | Freq: Every day | ORAL | Status: DC
  Administered 2015-06-04 – 2015-06-05 (×2): 1000 ug via ORAL
  Filled 2015-06-03 (×2): qty 1

## 2015-06-03 MED ORDER — ASPIRIN EC 325 MG PO TBEC
325.0000 mg | DELAYED_RELEASE_TABLET | Freq: Once | ORAL | Status: AC
  Administered 2015-06-03: 325 mg via ORAL
  Filled 2015-06-03: qty 1

## 2015-06-03 MED ORDER — SODIUM CHLORIDE 0.9 % IV BOLUS (SEPSIS)
1000.0000 mL | Freq: Once | INTRAVENOUS | Status: AC
  Administered 2015-06-03: 1000 mL via INTRAVENOUS

## 2015-06-03 MED ORDER — ANASTROZOLE 1 MG PO TABS
1.0000 mg | ORAL_TABLET | Freq: Every day | ORAL | Status: DC
  Administered 2015-06-04 – 2015-06-05 (×2): 1 mg via ORAL
  Filled 2015-06-03 (×3): qty 1

## 2015-06-03 MED ORDER — HEPARIN SODIUM (PORCINE) 5000 UNIT/ML IJ SOLN
5000.0000 [IU] | Freq: Three times a day (TID) | INTRAMUSCULAR | Status: DC
  Administered 2015-06-03 – 2015-06-04 (×2): 5000 [IU] via SUBCUTANEOUS
  Filled 2015-06-03 (×3): qty 1

## 2015-06-03 MED ORDER — MORPHINE SULFATE (PF) 4 MG/ML IV SOLN: 4.0000 mg | Freq: Once | INTRAVENOUS | Status: DC

## 2015-06-03 MED ORDER — KETOROLAC TROMETHAMINE 15 MG/ML IJ SOLN
15.0000 mg | Freq: Four times a day (QID) | INTRAMUSCULAR | Status: DC | PRN
  Administered 2015-06-03 – 2015-06-04 (×4): 15 mg via INTRAVENOUS
  Filled 2015-06-03 (×4): qty 1

## 2015-06-03 MED ORDER — PANTOPRAZOLE SODIUM 40 MG IV SOLR
40.0000 mg | Freq: Once | INTRAVENOUS | Status: AC
  Administered 2015-06-03: 40 mg via INTRAVENOUS
  Filled 2015-06-03: qty 40

## 2015-06-03 MED ORDER — PANTOPRAZOLE SODIUM 40 MG PO TBEC
40.0000 mg | DELAYED_RELEASE_TABLET | Freq: Every day | ORAL | Status: DC
  Administered 2015-06-04 – 2015-06-05 (×2): 40 mg via ORAL
  Filled 2015-06-03 (×2): qty 1

## 2015-06-03 MED ORDER — NITROGLYCERIN 0.4 MG SL SUBL
0.4000 mg | SUBLINGUAL_TABLET | SUBLINGUAL | Status: DC | PRN
  Administered 2015-06-03 (×2): 0.4 mg via SUBLINGUAL
  Filled 2015-06-03: qty 1

## 2015-06-03 MED ORDER — NITROGLYCERIN IN D5W 200-5 MCG/ML-% IV SOLN
0.0000 ug/min | Freq: Once | INTRAVENOUS | Status: DC
  Filled 2015-06-03: qty 250

## 2015-06-03 MED ORDER — ACETAMINOPHEN 325 MG PO TABS
650.0000 mg | ORAL_TABLET | Freq: Four times a day (QID) | ORAL | Status: DC | PRN
  Administered 2015-06-03 – 2015-06-05 (×4): 650 mg via ORAL
  Filled 2015-06-03 (×4): qty 2

## 2015-06-03 MED ORDER — SODIUM CHLORIDE 0.9 % IJ SOLN: 3.0000 mL | Freq: Two times a day (BID) | INTRAMUSCULAR | Status: DC

## 2015-06-03 MED ORDER — SIMVASTATIN 10 MG PO TABS
10.0000 mg | ORAL_TABLET | Freq: Every day | ORAL | Status: DC
  Administered 2015-06-03 – 2015-06-05 (×2): 10 mg via ORAL
  Filled 2015-06-03 (×2): qty 1

## 2015-06-03 MED ORDER — SODIUM CHLORIDE 0.9 % IJ SOLN
3.0000 mL | Freq: Two times a day (BID) | INTRAMUSCULAR | Status: DC
  Administered 2015-06-04 – 2015-06-05 (×3): 3 mL via INTRAVENOUS

## 2015-06-03 MED ORDER — SODIUM CHLORIDE 0.9 % IJ SOLN
3.0000 mL | INTRAMUSCULAR | Status: DC | PRN
  Administered 2015-06-05: 3 mL via INTRAVENOUS
  Filled 2015-06-03: qty 3

## 2015-06-03 MED ORDER — ONDANSETRON HCL 4 MG/2ML IJ SOLN: 4.0000 mg | Freq: Once | INTRAMUSCULAR | Status: DC

## 2015-06-03 MED ORDER — ONDANSETRON HCL 4 MG PO TABS: 4.0000 mg | ORAL_TABLET | Freq: Four times a day (QID) | ORAL | Status: DC | PRN

## 2015-06-03 MED ORDER — GI COCKTAIL ~~LOC~~
30.0000 mL | Freq: Once | ORAL | Status: AC
  Administered 2015-06-03: 30 mL via ORAL
  Filled 2015-06-03: qty 30

## 2015-06-03 MED ORDER — ONDANSETRON HCL 4 MG/2ML IJ SOLN
4.0000 mg | Freq: Four times a day (QID) | INTRAMUSCULAR | Status: DC | PRN
  Administered 2015-06-04: 4 mg via INTRAVENOUS
  Filled 2015-06-03: qty 2

## 2015-06-03 MED ORDER — ALUM & MAG HYDROXIDE-SIMETH 200-200-20 MG/5ML PO SUSP: 30.0000 mL | Freq: Four times a day (QID) | ORAL | Status: DC | PRN

## 2015-06-03 MED ORDER — ACETAMINOPHEN 650 MG RE SUPP: 650.0000 mg | Freq: Four times a day (QID) | RECTAL | Status: DC | PRN

## 2015-06-03 NOTE — ED Notes (Signed)
Per cards PA and admitting MD, Randell Patient; hold NTG gtt. Give SL nitro and use paste if needed after that.

## 2015-06-03 NOTE — Consult Note (Signed)
CARDIOLOGY CONSULT NOTE   Patient ID: Cheryl Gray MRN: [id number] DOB/AGE: 1945/12/28 69 y.o.  Admit Date: 06/03/2015 Referring Physician: Teaching Service Primary Physician: Webb Silversmith, NP Consulting Cardiologist: Candee Furbish MD Primary Cardiologist: New Reason for Consultation: Chest Pain  Clinical Summary Cheryl Gray is a 69 y.o.female with no prior cardiac history but history of hypertension, hypercholesterolemia. Breast cancer, and GERD who presents to the ER with complaints of constant chest pain since 11 am this morning with symptoms of spams and pressure for about a week, coming and going, under her left breast. She has breast implants. She also complained of rapid HR and at times. She states that due to the worsening chest pain that did not go away.  Pain is reproducible with palpation. She took a pain medication left over from left mastectomy to help relieve the pain but this did not relieve her symptoms.   On arrival to ER, BP 131/81 HR 76, O2 Sat 99%, afebrile. Glucose 111, BUN 25, CXR negative for CHF or pneumonia. Troponin is negative initially with 0.00 and 0.01 respectively. She is not anemic. EKG with non -specific anterior T-wave abnormalities.  She was treated with ASA, NTG sublingual, GI cocktail, zofran and morphine. She continues to have pain, but lessened. 9/10 to 5/10.    Allergies  Allergen Reactions  . Codeine     hallucinations.  . Tape Other (See Comments)    Blisters skin if on too long    Medications Scheduled Medications: .  morphine injection  4 mg Intravenous Once  . nitroGLYCERIN  0-200 mcg/min Intravenous Once  . ondansetron (ZOFRAN) IV  4 mg Intravenous Once      PRN Medications: ketorolac, nitroGLYCERIN   Past Medical History  Diagnosis Date  . Hypertension   . Chicken pox   . Bronchitis   . GERD (gastroesophageal reflux disease)   . Thyroid condition   . Hyperlipidemia   . Cancer [facility])     Breast cancer     Past Surgical History  Procedure Laterality Date  . Abdominal hysterectomy  1989  . Appendectomy  1975  . Cholecystectomy  1975  . Shoulder surgery Left 2005  . Breast surgery  Breast lift    Family History  Problem Relation Age of Onset  . Hyperlipidemia Mother   . Hypertension Mother   . Cancer Father     lung  . Hyperlipidemia Father   . Hypertension Father   . Heart disease Brother     Stents    Social History Cheryl Gray reports that she has quit smoking. She does not have any smokeless tobacco history on file. Cheryl Gray reports that she does not drink alcohol.  Review of Systems Complete review of systems are found to be negative unless outlined in H&P above.  Physical Examination Blood pressure 145/74, pulse 97, temperature 98 F (36.7 C), temperature source Oral, resp. rate 15, height 5' (1.524 m), weight 200 lb (90.719 kg), last menstrual period 06/17/1987, SpO2 98 %. No intake or output data in the 24 hours ending 06/03/15 1703  Telemetry:  GEN: Complaining of chest pain. HEENT: Conjunctiva and lids normal, oropharynx clear with moist mucosa.Xanthalamous bilaterally.  Neck: Supple, no elevated JVP or carotid bruits, no thyromegaly. Lungs: Clear to auscultation, nonlabored breathing at rest. Cardiac: Regular rate and rhythm,no S3 2/6 SEM RUSB, no pericardial rub. Abdomen: Soft, nontender, no hepatomegaly, bowel sounds present, no guarding or rebound. Extremities: No pitting edema, distal pulses 2+. Skin: Warm and  dry. Musculoskeletal: No kyphosis.Pain is reproducible with palpation of left breast.  Neuropsychiatric: Alert and oriented x3, affect grossly appropriate.  Prior Cardiac Testing/Procedures None  Lab Results  Basic Metabolic Panel:  Recent Labs Lab 06/03/15 1200  NA 136  K 4.3  CL 100*  CO2 24  GLUCOSE 111*  BUN 25*  CREATININE 0.90  CALCIUM 9.4    CBC:  Recent Labs Lab 06/03/15 1200  WBC 7.4  HGB 12.7  HCT 40.0   MCV 92.4  PLT 289    Radiology: Dg Chest 2 View  06/03/2015  CLINICAL DATA:  Intermittent chest pain for week, chest tightness and shortness of breath. History of hypertension. History of right-sided mastectomy 1 year ago. EXAM: CHEST  2 VIEW COMPARISON:  Chest x-ray dated 03/31/2014. FINDINGS: Heart size is normal. Overall cardiomediastinal silhouette remains normal in size and configuration. Lungs are clear. No evidence of pneumonia. No pleural effusion. No pneumothorax seen. Mild degenerative change again noted throughout the slightly kyphotic thoracic spine. Osseous structures about the chest are otherwise unremarkable. IMPRESSION: Lungs are clear and there is no evidence of acute cardiopulmonary abnormality. Electronically Signed   By: Franki Cabot M.D.   On: 06/03/2015 12:42     ECG: NSR with non-specific anterior T-wave abnormalities.    Impression and Recommendations  1.Atypical Chest Pain: Described as spasm-like for the last week, clenching pain that comes and goes with associated pressure, not radiating.. Today pain began around 11 am and has been constant since that time. Pain relievers have not relieved pain, but has reduced it from 9/10 to 5/10. Troponin minimally elevated at 0.01.   Will cycle cardiac enzymes, place NTG paste. CVRF of obesity, FH, Hypertension, Hypercholesterolemia, former smoker (stopped 4 years ago). Will likely benefit from [place] stress test if troponin is negative. Echo will be ordered for LV fx. Can consider low dose BB, but BP and HR are controlled currently.   2.  Hypertension: Has been compliant with medications. Takes Prinizide for control. Potassium is normal and renal function is WNL  3. Hypercholesterolemia: Takes simvastatin at home. Will check fasting lipids and LFTs in am.   4. Hx of Breast Cancer: Has had mastectomy with breast implants. Followed by [facility].   Signed: Phill Myron. [place] NP [place]  06/03/2015, 5:03  PM Co-Sign MD  Personally seen and examined. Agree with above.  Atypical CP. But has some typical features (heaviness). Pounding/palpitations correlated with PAC, bigeminy on monitor. Explained to her and family. If Trop normal, in AM will be candidate for either inpatient or outpatient NUC stress.  Echo will be helpful with murmur ? AS.  Candee Furbish, MD

## 2015-06-03 NOTE — ED Provider Notes (Signed)
CSN: [id number]     Arrival date & time 06/03/15  1150 History   First MD Initiated Contact with Patient 06/03/15 1357     Chief Complaint  Patient presents with  . Chest Pain     (Consider location/radiation/quality/duration/timing/severity/associated sxs/prior Treatment) Patient is a 69 y.o. female presenting with chest pain. The history is provided by the patient.  Chest Pain Pain location:  L chest Pain quality: pressure and tightness   Pain radiates to:  Does not radiate Pain radiates to the back: no   Pain severity:  Moderate Onset quality:  Gradual Duration:  1 week Timing:  Intermittent Progression:  Worsening Chronicity:  New Context: at rest   Relieved by:  Nothing Worsened by:  Exertion Ineffective treatments:  Antacids (tylenol, ibuprofen) Associated symptoms: dizziness, heartburn, nausea and shortness of breath   Associated symptoms: no abdominal pain, no altered mental status, no back pain, no claudication, no cough, no fever and no syncope   Risk factors: hypertension and obesity   Risk factors: no smoking     Cheryl Gray is a 69 y.o. female with PMH significant for HTN, GERD, HLD, breast cancer with reconstructive surgeries to left breast who presents with gradual onset, intermittent, progressively worsening non-radiating left sided chest pain unrelieved by OTC medication.  Made worse with exertion.  Denies cardiac history or previous cardiac work up.  Denies tobacco use.  She states she has not experienced this pain before, and it is different than her typical indigestion/heartburn chest pain.    Past Medical History  Diagnosis Date  . Hypertension   . Chicken pox   . Bronchitis   . GERD (gastroesophageal reflux disease)   . Thyroid condition   . Hyperlipidemia   . Cancer [facility])     Breast cancer   Past Surgical History  Procedure Laterality Date  . Abdominal hysterectomy  1989  . Appendectomy  1975  . Cholecystectomy  1975  . Shoulder surgery  Left 2005  . Breast surgery  Breast lift   Family History  Problem Relation Age of Onset  . Hyperlipidemia Mother   . Hypertension Mother   . Cancer Father     lung  . Hyperlipidemia Father   . Hypertension Father    Social History  Substance Use Topics  . Smoking status: Former Research scientist (life sciences)  . Smokeless tobacco: None  . Alcohol Use: No   OB History    No data available     Review of Systems  Constitutional: Negative for fever.  Respiratory: Positive for chest tightness and shortness of breath. Negative for cough.   Cardiovascular: Positive for chest pain. Negative for claudication and syncope.  Gastrointestinal: Positive for heartburn and nausea. Negative for abdominal pain.  Genitourinary: Negative for dysuria, frequency and hematuria.  Musculoskeletal: Negative for back pain.  Neurological: Positive for dizziness and light-headedness.  All other systems reviewed and are negative.     Allergies  Codeine and Tape  Home Medications   Prior to Admission medications   Medication Sig Start Date End Date Taking? Authorizing Provider  acetaminophen (TYLENOL) 500 MG tablet Take 500 mg by mouth every 6 (six) hours as needed for mild pain, moderate pain or headache.   Yes Historical Provider, MD  anastrozole (ARIMIDEX) 1 MG tablet Take 1 mg by mouth daily.   Yes Historical Provider, MD  Cyanocobalamin (RA VITAMIN B-12 TR) 1000 MCG TBCR Take 1,000 mcg by mouth daily.   Yes Historical Provider, MD  lisinopril-hydrochlorothiazide (PRINZIDE,ZESTORETIC) 20-12.5 MG  per tablet Take 1 tablet by mouth daily. 08/25/13  Yes Jearld Fenton, NP  pantoprazole (PROTONIX) 20 MG tablet Take 1 tablet (20 mg total) by mouth daily. 10/03/13  Yes Irene Pap, NP  simvastatin (ZOCOR) 10 MG tablet Take 1 tablet (10 mg total) by mouth at bedtime. 01/25/13  Yes Jearld Fenton, NP  traMADol (ULTRAM) 50 MG tablet Take 50 mg by mouth every 6 (six) hours as needed for moderate pain or severe pain.   Yes  Historical Provider, MD  benzonatate (TESSALON) 100 MG capsule Take 1-2 capsules po up to 3 times daily PRN cough Patient not taking: Reported on 06/03/2015 05/27/13   Irene Pap, NP   BP 129/75 mmHg  Pulse 77  Temp(Src) 98 F (36.7 C) (Oral)  Resp 21  Ht 5' (1.524 m)  Wt 90.719 kg  BMI 39.06 kg/m2  SpO2 96%  LMP 06/17/1987 Physical Exam  Constitutional: She is oriented to person, place, and time. She appears well-developed and well-nourished.  HENT:  Head: Normocephalic and atraumatic.  Mouth/Throat: Oropharynx is clear and moist.  Eyes: Conjunctivae are normal. Pupils are equal, round, and reactive to light.  Neck: Normal range of motion. Neck supple.  Cardiovascular: Normal rate, regular rhythm and normal heart sounds.   No murmur heard. Pulmonary/Chest: Effort normal and breath sounds normal. No accessory muscle usage or stridor. No respiratory distress. She has no wheezes. She has no rhonchi. She has no rales. She exhibits no tenderness.    Abdominal: Soft. Bowel sounds are normal. She exhibits no distension. There is no tenderness.  Musculoskeletal: Normal range of motion.  Lymphadenopathy:    She has no cervical adenopathy.  Neurological: She is alert and oriented to person, place, and time.  Speech clear without dysarthria.  Skin: Skin is warm and dry.  Psychiatric: She has a normal mood and affect. Her behavior is normal.    ED Course  Procedures (including critical care time) Labs Review Labs Reviewed  BASIC METABOLIC PANEL - Abnormal; Notable for the following:    Chloride 100 (*)    Glucose, Bld 111 (*)    BUN 25 (*)    All other components within normal limits  CBC  I-STAT TROPOININ, ED  I-STAT TROPOININ, ED    Imaging Review Dg Chest 2 View  06/03/2015  CLINICAL DATA:  Intermittent chest pain for week, chest tightness and shortness of breath. History of hypertension. History of right-sided mastectomy 1 year ago. EXAM: CHEST  2 VIEW COMPARISON:   Chest x-ray dated 03/31/2014. FINDINGS: Heart size is normal. Overall cardiomediastinal silhouette remains normal in size and configuration. Lungs are clear. No evidence of pneumonia. No pleural effusion. No pneumothorax seen. Mild degenerative change again noted throughout the slightly kyphotic thoracic spine. Osseous structures about the chest are otherwise unremarkable. IMPRESSION: Lungs are clear and there is no evidence of acute cardiopulmonary abnormality. Electronically Signed   By: Franki Cabot M.D.   On: 06/03/2015 12:42   I have personally reviewed and evaluated these images and lab results as part of my medical decision-making.   EKG Interpretation   Date/Time:  Sunday June 03 2015 11:57:20 EST Ventricular Rate:  80 PR Interval:  168 QRS Duration: 74 QT Interval:  370 QTC Calculation: 426 R Axis:   16 Text Interpretation:  Normal sinus rhythm Low voltage QRS Cannot rule out  Anterior infarct , age undetermined Abnormal ECG Confirmed by Jeneen Rinks  MD,  Coyle ([id number]) on 06/03/2015 3:27:23 PM  MDM   Final diagnoses:  Chest pain, unspecified chest pain type    Patient presents with 1 week history of gradually worsening intermittent chest pain that has now become constant in nature.  Appear to have an exertional component.  No cardiac history or previous cardiac work up.  Blood pressure 152/80, pulse 82, temperature 98 F (36.7 C), temperature source Oral, resp. rate 22, height 5' (1.524 m), weight 90.719 kg, last menstrual period 06/17/1987, SpO2 100 %.  On exam, heart RRR, lungs CTAB, abdomen soft and benign.  Initial labs unremarkable, troponin x 2, 0.00.  CXR negative.  EKG shows NSR, possibly poor R wave progression.  HEART score 6.  Possible post-surgical pain? Will admit for ACS r/o and further cardiac workup.  Patient given GI cocktail, protonix, ASA and no resolution of symptoms.  Will start IV nitroglycerin and give morphine.  Will consult cardiology, Dr. Marlou Porch.  Will  admit to medicine. Case has been discussed with and seen by Dr. Jeneen Rinks who agrees with the above plan for admission.      Gloriann Loan, PA-C 06/03/15 1616  Tanna Furry, MD 06/03/15 [phone]

## 2015-06-03 NOTE — ED Notes (Addendum)
Patient's daughter notified [employer] that patients pain level is increasing.

## 2015-06-03 NOTE — ED Notes (Addendum)
Patient states pain is the same despite GI cocktail, nitro or toradol. Still refuses Morphine.

## 2015-06-03 NOTE — ED Notes (Signed)
Pt c/o intermittent left sided chest pain x 1 week, increasing past couple days.

## 2015-06-03 NOTE — H&P (Signed)
Date: 06/03/2015               Patient Name:  Cheryl Gray MRN: [id number]  DOB: 1946-01-24 Age / Sex: 69 y.o., female   PCP: Jearld Fenton, NP         Medical Service: Internal Medicine Teaching Service         Attending Physician: Dr. Axel Filler, MD    First Contact: Dr. Blane Ohara Pager: [phone]  Second Contact: Dr. Jacques Earthly Pager: [phone]       After Hours (After 5p/  First Contact Pager: [phone]  weekends / holidays): Second Contact Pager: [phone]   Chief Complaint: Chest pain  History of Present Illness: Mrs. Cheryl Gray is a 69yo F with PMH of HTN, HLD, GERD, Stage I left-sided breast cancer s/p mastectomy and 2 reconstruction surgeries ~6 months ago now on anastrazole, who presents with 1 week of progressively worsening squeezing chest pain under her left breast. She says this started one week ago, was intermittent coming in brief spells not associated with activity, but since yesterday the episodes became more intense and frequent, and this morning the pain became constant which prompted her to come in. She describes the pain as squeezing and pressure under the left breast, non-radiating, occasionally associated with palpitations and shortness of breath, but no other associated symptoms. She says that some pain medications she still had from her mastectomy did not relieve the pain. She says that she has been under tremendous stress recently, with multiple deaths in the family and her cancer diagnosis. She also says her baseline GERD symptoms of indigestion have also been more frequent recently. She denies fevers, malaise, diaphoresis, cough, URI symptoms, abdominal pain, nausea, vomiting, urinary or bowel changes, numbness, weakness, tingling, recent travel or sick contacts.  In the ED, her vitals were normal, CXR was negative. Troponin was negative initially with 0.00 and 0.01 respectively. EKG showed non-specific T-wave abnormalities in the anterior leads.  She was treated with ASA, SL nitro, a GI cocktail, and zofran, which helped intermittently.  Meds: Current Facility-Administered Medications  Medication Dose Route Frequency Provider Last Rate Last Dose  . ketorolac (TORADOL) 15 MG/ML injection 15 mg  15 mg Intravenous Q6H PRN Milagros Loll, MD   15 mg at 06/03/15 1704  . morphine 4 MG/ML injection 4 mg  4 mg Intravenous Once Gloriann Loan, PA-C   Stopped at 06/03/15 1551  . nitroGLYCERIN (NITROSTAT) SL tablet 0.4 mg  0.4 mg Sublingual Q5 min PRN Gloriann Loan, PA-C   0.4 mg at 06/03/15 1647  . nitroGLYCERIN 50 mg in dextrose 5 % 250 mL (0.2 mg/mL) infusion  0-200 mcg/min Intravenous Once Gloriann Loan, PA-C   Stopped at 06/03/15 1642  . ondansetron (ZOFRAN) injection 4 mg  4 mg Intravenous Once Gloriann Loan, PA-C   Stopped at 06/03/15 1550    Allergies: Allergies as of 06/03/2015 - Review Complete 06/03/2015  Allergen Reaction Noted  . Codeine  02/16/2012  . Tape Other (See Comments) 06/03/2015   Past Medical History  Diagnosis Date  . Hypertension   . Chicken pox   . Bronchitis   . GERD (gastroesophageal reflux disease)   . Thyroid condition   . Hyperlipidemia   . Cancer [facility])     Breast cancer   Past Surgical History  Procedure Laterality Date  . Abdominal hysterectomy  1989  . Appendectomy  1975  . Cholecystectomy  1975  . Shoulder surgery Left 2005  . Breast surgery  Breast lift   Family History  Problem Relation Age of Onset  . Hyperlipidemia Mother   . Hypertension Mother   . Cancer Father     lung  . Hyperlipidemia Father   . Hypertension Father   . Heart disease Brother     Stents   Social History   Social History  . Marital Status: Single    Spouse Name: N/A  . Number of Children: 3   . Years of Education: 9   Occupational History  . Retired    Social History Main Topics  . Smoking status: Former Research scientist (life sciences)  . Smokeless tobacco: Not on file  . Alcohol Use: No  . Drug Use: No  . Sexual Activity: Not  Currently   Other Topics Concern  . Not on file   Social History Narrative    Review of Systems: Pertinent items noted in HPI and 10 systems reviewed with remainder of comprehensive ROS otherwise negative.  Physical Exam: Blood pressure 145/74, pulse 97, temperature 98 F (36.7 C), temperature source Oral, resp. rate 15, height 5' (1.524 m), weight 200 lb (90.719 kg), last menstrual period 06/17/1987, SpO2 98 %.   Gen: Well-appearing, alert and oriented to person, place, and time HEENT: Oropharynx clear without erythema or exudate.  Neck: No cervical LAD, no thyromegaly or nodules, no JVD noted. CV: Normal rate, regular rhythm, grade 2/6 systolic ejection murmur heard best at the LSB, no rubs, or gallops. Pain is reproducible on palpation. Pulmonary: Normal effort, CTA bilaterally, no wheezing, rales, or rhonchi Abdominal: Soft, non-tender, non-distended, without rebound, guarding, or masses Extremities: Distal pulses 2+ in upper and lower extremities bilaterally, no tenderness, erythema or edema Skin: No atypical appearing moles. No rashes  Lab results: Basic Metabolic Panel:  Recent Labs  06/03/15 1200  NA 136  K 4.3  CL 100*  CO2 24  GLUCOSE 111*  BUN 25*  CREATININE 0.90  CALCIUM 9.4   CBC:  Recent Labs  06/03/15 1200  WBC 7.4  HGB 12.7  HCT 40.0  MCV 92.4  PLT 289   Imaging results:  Dg Chest 2 View  06/03/2015  CLINICAL DATA:  Intermittent chest pain for week, chest tightness and shortness of breath. History of hypertension. History of right-sided mastectomy 1 year ago. EXAM: CHEST  2 VIEW COMPARISON:  Chest x-ray dated 03/31/2014. FINDINGS: Heart size is normal. Overall cardiomediastinal silhouette remains normal in size and configuration. Lungs are clear. No evidence of pneumonia. No pleural effusion. No pneumothorax seen. Mild degenerative change again noted throughout the slightly kyphotic thoracic spine. Osseous structures about the chest are otherwise  unremarkable. IMPRESSION: Lungs are clear and there is no evidence of acute cardiopulmonary abnormality. Electronically Signed   By: Franki Cabot M.D.   On: 06/03/2015 12:42   Other results: EKG: normal EKG, normal sinus rhythm, non-specific T-wave abnormalities in the anterior leads.  Assessment & Plan by Problem: Active Problems:   Chest pain 1. Atypical chest pain - progressively worsening, not associated with activity or relieved by rest. Does have recent h/o multiple breast reconstruction surgeries, latest ~6 months ago. No h/o radiation. Troponins initially negative, EKG shows nonspecific T-wave abnormalities in anterior leads. Only partially relieved by nitro, ASA, and GI cocktail. However, does have several cardiac RFs, including past history of smoking, HTN, HLD, family history, and obesity. Most likely  stress-induced vs GERD, however must consider atypical anginal chest pain given RFs. -Cardiology consulted; will likely benefit from [facility] w/ negative troponins -Initially on nitro  gtt; switched to nitro SL/paste -May consider starting low-dose beta blocker although BP normal here -F/u TTE -F/u CBC, CMP -A1c, lipid panel -Acetaminophen, Toradol 15mg  q6hrs PRN for pain -Zofran 4mg  q6hrs PRN for nausea -NPO at midnight  2. History of early-stage breast cancer -Continue home anastrazole  3. GERD -Continue home pantoprazole -GI cocktail PRN for dyspepsia  4. Hyperlipidemia -Continue home simvastatin  PPX - DVT - Heparin  Dispo: Disposition is deferred at this time, awaiting improvement of current medical problems. Anticipated discharge in approximately 1-3 day(s).   The patient does have a current PCP Jearld Fenton, NP) and does need an [facility] hospital follow-up appointment after discharge.  The patient does not have transportation limitations that hinder transportation to clinic appointments.  Signed: Norval Gable, MD 06/03/2015, 6:21 PM

## 2015-06-04 ENCOUNTER — Inpatient Hospital Stay (HOSPITAL_COMMUNITY): Payer: Medicare Other

## 2015-06-04 ENCOUNTER — Inpatient Hospital Stay (HOSPITAL_BASED_OUTPATIENT_CLINIC_OR_DEPARTMENT_OTHER): Payer: Medicare Other

## 2015-06-04 DIAGNOSIS — R0789 Other chest pain: Secondary | ICD-10-CM | POA: Diagnosis not present

## 2015-06-04 DIAGNOSIS — K219 Gastro-esophageal reflux disease without esophagitis: Secondary | ICD-10-CM | POA: Diagnosis not present

## 2015-06-04 DIAGNOSIS — I480 Paroxysmal atrial fibrillation: Secondary | ICD-10-CM | POA: Insufficient documentation

## 2015-06-04 DIAGNOSIS — R079 Chest pain, unspecified: Secondary | ICD-10-CM | POA: Diagnosis not present

## 2015-06-04 DIAGNOSIS — C50911 Malignant neoplasm of unspecified site of right female breast: Secondary | ICD-10-CM | POA: Diagnosis not present

## 2015-06-04 DIAGNOSIS — Z853 Personal history of malignant neoplasm of breast: Secondary | ICD-10-CM | POA: Diagnosis not present

## 2015-06-04 LAB — COMPREHENSIVE METABOLIC PANEL
ALBUMIN: 3.4 g/dL — AB (ref 3.5–5.0)
ALK PHOS: 67 U/L (ref 38–126)
ALT: 18 U/L (ref 14–54)
ANION GAP: 10 (ref 5–15)
AST: 16 U/L (ref 15–41)
BILIRUBIN TOTAL: 0.7 mg/dL (ref 0.3–1.2)
BUN: 24 mg/dL — AB (ref 6–20)
CALCIUM: 9 mg/dL (ref 8.9–10.3)
CO2: 24 mmol/L (ref 22–32)
Chloride: 103 mmol/L (ref 101–111)
Creatinine, Ser: 0.85 mg/dL (ref 0.44–1.00)
GFR calc Af Amer: >60 mL/min (ref >60)
GFR calc non Af Amer: >60 mL/min (ref >60)
GLUCOSE: 125 mg/dL — AB (ref 65–99)
POTASSIUM: 3.6 mmol/L (ref 3.5–5.1)
SODIUM: 137 mmol/L (ref 135–145)
Total Protein: 6.3 g/dL — ABNORMAL LOW (ref 6.5–8.1)

## 2015-06-04 LAB — CBC
HEMATOCRIT: 38.2 % (ref 36.0–46.0)
HEMOGLOBIN: 12.2 g/dL (ref 12.0–15.0)
MCH: 29.5 pg (ref 26.0–34.0)
MCHC: 31.9 g/dL (ref 30.0–36.0)
MCV: 92.3 fL (ref 78.0–100.0)
Platelets: 246 10*3/uL (ref 150–400)
RBC: 4.14 MIL/uL (ref 3.87–5.11)
RDW: 13 % (ref 11.5–15.5)
WBC: 4.9 10*3/uL (ref 4.0–10.5)

## 2015-06-04 LAB — NM MYOCAR MULTI W/SPECT W/WALL MOTION / EF
CHL CUP RESTING HR STRESS: 68 {beats}/min
Exercise duration (min): 4 min

## 2015-06-04 LAB — TROPONIN I

## 2015-06-04 LAB — TSH: TSH: 0.768 u[IU]/mL (ref 0.350–4.500)

## 2015-06-04 MED ORDER — LISINOPRIL-HYDROCHLOROTHIAZIDE 20-12.5 MG PO TABS: 1.0000 | ORAL_TABLET | Freq: Every day | ORAL | Status: DC

## 2015-06-04 MED ORDER — REGADENOSON 0.4 MG/5ML IV SOLN
0.4000 mg | Freq: Once | INTRAVENOUS | Status: AC
  Administered 2015-06-04: 0.4 mg via INTRAVENOUS
  Filled 2015-06-04: qty 5

## 2015-06-04 MED ORDER — LISINOPRIL 20 MG PO TABS
20.0000 mg | ORAL_TABLET | Freq: Every day | ORAL | Status: DC
  Administered 2015-06-05: 20 mg via ORAL
  Filled 2015-06-04: qty 1

## 2015-06-04 MED ORDER — REGADENOSON 0.4 MG/5ML IV SOLN
INTRAVENOUS | Status: AC
  Administered 2015-06-04: 0.4 mg via INTRAVENOUS
  Filled 2015-06-04: qty 5

## 2015-06-04 MED ORDER — PANTOPRAZOLE SODIUM 40 MG PO TBEC: 40.0000 mg | DELAYED_RELEASE_TABLET | Freq: Every day | ORAL | Status: AC

## 2015-06-04 MED ORDER — HYDROCHLOROTHIAZIDE 12.5 MG PO CAPS
12.5000 mg | ORAL_CAPSULE | Freq: Every day | ORAL | Status: DC
  Administered 2015-06-05: 12.5 mg via ORAL
  Filled 2015-06-04: qty 1

## 2015-06-04 MED ORDER — RIVAROXABAN 20 MG PO TABS: 20.0000 mg | ORAL_TABLET | Freq: Every day | ORAL | Status: DC

## 2015-06-04 MED ORDER — TECHNETIUM TC 99M SESTAMIBI GENERIC - CARDIOLITE
30.0000 | Freq: Once | INTRAVENOUS | Status: AC | PRN
  Administered 2015-06-04: 30 via INTRAVENOUS

## 2015-06-04 MED ORDER — TECHNETIUM TC 99M SESTAMIBI GENERIC - CARDIOLITE
10.0000 | Freq: Once | INTRAVENOUS | Status: AC | PRN
  Administered 2015-06-04: 10 via INTRAVENOUS

## 2015-06-04 NOTE — Progress Notes (Signed)
Patient Name: Cheryl Gray Date of Encounter: 06/04/2015  Active Problems:   Chest pain   Pain in the chest    Primary Cardiologist: New - Dr. Marlou Porch Patient Profile: 69 yo female w/ PMH of HTN, HLD, Breast cancer, and GERD who presented to [facility] ED on 06/03/2015 for constant chest pain since earlier that morning. Cyclic troponin values have been negative. For NST today.  SUBJECTIVE: Reports still having chest pain, relieved when she receives Toradol. Denies any shortness of breath. Has been NPO since midnight for NST.  OBJECTIVE Filed Vitals:   06/03/15 1825 06/03/15 2012 06/04/15 0440 06/04/15 0458  BP: 138/73 124/55 125/68 139/73  Pulse: 75 75 68 67  Temp: 98.5 F (36.9 C) 98.4 F (36.9 C) 98.2 F (36.8 C) 98.1 F (36.7 C)  TempSrc: Oral Oral Oral Oral  Resp:   16 18  Height: 5' (1.524 m)     Weight:    203 lb 1.6 oz (92.126 kg)  SpO2:  96% 98% 98%    Intake/Output Summary (Last 24 hours) at 06/04/15 1053 Last data filed at 06/04/15 0500  Gross per 24 hour  Intake      0 ml  Output    200 ml  Net   -200 ml   Filed Weights   06/03/15 1159 06/04/15 0458  Weight: 200 lb (90.719 kg) 203 lb 1.6 oz (92.126 kg)    PHYSICAL EXAM General: Well developed, well nourished, female in no acute distress. Head: Normocephalic, atraumatic.  Neck: Supple without bruits, JVD not elevated. Lungs:  Resp regular and unlabored, CTA without wheezing or rales. Heart: RRR, S1, S2, no S3, S4, 2/6 SEM at RUSB; no rub. Tender to palpation along left pectoral region. Abdomen: Soft, non-tender, non-distended with normoactive bowel sounds. No hepatomegaly. No rebound/guarding. No obvious abdominal masses. Extremities: No clubbing, cyanosis, or edema. Distal pedal pulses are 2+ bilaterally. Neuro: Alert and oriented X 3. Moves all extremities spontaneously. Psych: Normal affect.  LABS: CBC: Recent Labs  06/03/15 1200 06/04/15 0400  WBC 7.4 4.9  HGB 12.7 12.2  HCT 40.0  38.2  MCV 92.4 92.3  PLT 289 246   INR:No results for input(s): INR in the last 72 hours. Basic Metabolic Panel: Recent Labs  06/03/15 1200 06/04/15 0500  NA 136 137  K 4.3 3.6  CL 100* 103  CO2 24 24  GLUCOSE 111* 125*  BUN 25* 24*  CREATININE 0.90 0.85  CALCIUM 9.4 9.0   Liver Function Tests: Recent Labs  06/04/15 0500  AST 16  ALT 18  ALKPHOS 67  BILITOT 0.7  PROT 6.3*  ALBUMIN 3.4*   Cardiac Enzymes: Recent Labs  06/03/15 2127 06/04/15 0400  TROPONINI <0.03 <0.03    Recent Labs  06/03/15 1214 06/03/15 1548  TROPIPOC 0.01 0.00   Fasting Lipid Panel: Recent Labs  06/03/15 2127  CHOL 157  HDL 45  LDLCALC 89  TRIG 113  CHOLHDL 3.5    TELE:  NSR with rate in upper 40's - 70's. No atopic events.      ECG: Atrial fibrillation with rate in 80's.  ECHO: Pending  Radiology/Studies: Dg Chest 2 View: 06/03/2015  CLINICAL DATA:  Intermittent chest pain for week, chest tightness and shortness of breath. History of hypertension. History of right-sided mastectomy 1 year ago. EXAM: CHEST  2 VIEW COMPARISON:  Chest x-ray dated 03/31/2014. FINDINGS: Heart size is normal. Overall cardiomediastinal silhouette remains normal in size and configuration. Lungs are clear.  No evidence of pneumonia. No pleural effusion. No pneumothorax seen. Mild degenerative change again noted throughout the slightly kyphotic thoracic spine. Osseous structures about the chest are otherwise unremarkable. IMPRESSION: Lungs are clear and there is no evidence of acute cardiopulmonary abnormality. Electronically Signed   By: Franki Cabot M.D.   On: 06/03/2015 12:42     Current Medications:  . anastrozole  1 mg Oral Daily  . heparin  5,000 Units Subcutaneous 3 times per day  . pantoprazole  40 mg Oral Daily  . simvastatin  10 mg Oral QHS  . sodium chloride  3 mL Intravenous Q12H  . vitamin B-12  1,000 mcg Oral Daily      ASSESSMENT AND PLAN:  1. Atypical Chest pain - had episode of  chest pain which was constant throughout the day. Reproducible with palpation. No associated symptoms. - cyclic troponin values negative. EKG shows no acute ischemic changes. - Echo to be obtained - For NST today. Has been NPO since midnight.  2. New Onset Atrial Fibrillation - This patients CHA2DS2-VASc Score and unadjusted Ischemic Stroke Rate (% per year) is equal to 3.2 % stroke rate/year from a score of 3 (HTN,  Female, Age). Possibly higher pending results of echo and NST. Will need to initiate anticoagulation.  - has episodes of bradycardia in the mid-40's at times. Would be hesitant to start BB.  - will obtain repeat EKG as her telemetry is now showing NSR.  3. HTN - BP has been 124/55 - 152/81 in the past 24 hours. - continue current medication regimen  4. HLD - continue statin  Signed, Erma Heritage , PA-C 10:53 AM 06/04/2015 Pager: [phone] Patient seen and examined and history reviewed. Agree with above findings and plan. Awaiting results of nuclear stress test and Echo today. Interestingly was documented to have afib. Rate controlled at 84 bpm so does not need AV nodal blocking agent. Will need to consider anticoagulation once results of other studies known.  Thao Bauza [place], [place] 06/04/2015 1:32 PM

## 2015-06-04 NOTE — Progress Notes (Signed)
  Echocardiogram 2D Echocardiogram has been performed.  Cheryl Gray 06/04/2015, 5:31 PM

## 2015-06-04 NOTE — Plan of Care (Signed)
Problem: Tissue Perfusion: Goal: Risk factors for ineffective tissue perfusion will decrease Outcome: Completed/Met Date Met:  06/04/15 Pt educated on VTE prophylaxis. Pt verbalized understanding.

## 2015-06-04 NOTE — Progress Notes (Signed)
Myoview low risk. Discussed with attending. We will see in the office for f/u in 1-2 weeks, they will arrange Coumadin f/u.   Kerin Ransom PA-C 06/04/2015 2:51 PM

## 2015-06-04 NOTE — Plan of Care (Signed)
Problem: Activity: Goal: Risk for activity intolerance will decrease Outcome: Completed/Met Date Met:  06/04/15 Pt tolerating activity well. Pt ambulating in room and hallway.

## 2015-06-04 NOTE — Plan of Care (Signed)
Problem: Safety: Goal: Ability to remain free from injury will improve Outcome: Completed/Met Date Met:  06/04/15 Pt understands the importance of using the call bell or calling our direct phone numbers if she needs to get up.  Bed alarm also placed on the patient in case she forgot.

## 2015-06-04 NOTE — Plan of Care (Signed)
Problem: Education: Goal: Knowledge of [place] [facility] Education information/materials will improve Outcome: Completed/Met Date Met:  06/04/15 Explained to the pt what an echocardiogram was and what the doctors would be looking for from this test. Also explained the importance of being NPO for any possible tests in the morning as we do not want to delay care.

## 2015-06-04 NOTE — Progress Notes (Signed)
Subjective: Cheryl Gray had one episode of atrial fibrillation overnight, max HR 105, no RVR, which was captured during her symptoms of squeezing/palpitations. Otherwise, she says that her chest pain is improving but she still has chest wall soreness and a lingering pressure sensation.  Objective: Vital signs in last 24 hours: Filed Vitals:   06/03/15 1825 06/03/15 2012 06/04/15 0440 06/04/15 0458  BP: 138/73 124/55 125/68 139/73  Pulse: 75 75 68 67  Temp: 98.5 F (36.9 C) 98.4 F (36.9 C) 98.2 F (36.8 C) 98.1 F (36.7 C)  TempSrc: Oral Oral Oral Oral  Resp:   16 18  Height: 5' (1.524 m)     Weight:    203 lb 1.6 oz (92.126 kg)  SpO2:  96% 98% 98%   Weight change:   Intake/Output Summary (Last 24 hours) at 06/04/15 1125 Last data filed at 06/04/15 0500  Gross per 24 hour  Intake      0 ml  Output    200 ml  Net   -200 ml     Gen: Well-appearing, alert and oriented to person, place, and time HEENT: Oropharynx clear without erythema or exudate.  Neck: No cervical LAD, no thyromegaly or nodules, no JVD noted. CV: Normal rate, regular rhythm, grade 2/6 systolic ejection murmur heard best at the LSB, no rubs, or gallops.  Pulmonary: Normal effort, CTA bilaterally, no wheezing, rales, or rhonchi Abdominal: Soft, non-tender, non-distended, without rebound, guarding, or masses Extremities: Distal pulses 2+ in upper and lower extremities bilaterally, no tenderness, erythema or edema Skin: No atypical appearing moles. No rashes  Lab Results: Basic Metabolic Panel:  Recent Labs Lab 06/03/15 1200 06/04/15 0500  NA 136 137  K 4.3 3.6  CL 100* 103  CO2 24 24  GLUCOSE 111* 125*  BUN 25* 24*  CREATININE 0.90 0.85  CALCIUM 9.4 9.0   Liver Function Tests:  Recent Labs Lab 06/04/15 0500  AST 16  ALT 18  ALKPHOS 67  BILITOT 0.7  PROT 6.3*  ALBUMIN 3.4*   CBC:  Recent Labs Lab 06/03/15 1200 06/04/15 0400  WBC 7.4 4.9  HGB 12.7 12.2  HCT 40.0 38.2  MCV  92.4 92.3  PLT 289 246   Cardiac Enzymes:  Recent Labs Lab 06/03/15 2127 06/04/15 0400  TROPONINI <0.03 <0.03   Fasting Lipid Panel:  Recent Labs Lab 06/03/15 2127  CHOL 157  HDL 45  LDLCALC 89  TRIG 113  CHOLHDL 3.5   Studies/Results: Dg Chest 2 View  06/03/2015  CLINICAL DATA:  Intermittent chest pain for week, chest tightness and shortness of breath. History of hypertension. History of right-sided mastectomy 1 year ago. EXAM: CHEST  2 VIEW COMPARISON:  Chest x-ray dated 03/31/2014. FINDINGS: Heart size is normal. Overall cardiomediastinal silhouette remains normal in size and configuration. Lungs are clear. No evidence of pneumonia. No pleural effusion. No pneumothorax seen. Mild degenerative change again noted throughout the slightly kyphotic thoracic spine. Osseous structures about the chest are otherwise unremarkable. IMPRESSION: Lungs are clear and there is no evidence of acute cardiopulmonary abnormality. Electronically Signed   By: Franki Cabot M.D.   On: 06/03/2015 12:42   Assessment/Plan: Active Problems:   Chest pain   Pain in the chest 1. Atypical chest pain - Does have recent h/o multiple breast reconstruction surgeries, latest ~6 months ago. No h/o radiation. Troponins initially negative, initial EKG on 12/18 showed nonspecific T-wave abnormalities in anterior leads. Only partially relieved by nitro, ASA, and GI cocktail. However, does  have several cardiac RFs, including past history of smoking, HTN, HLD, family history, and obesity. Most likely stress-induced vs GERD, however must consider atypical anginal chest pain given RFs. Tele did capture run of Afib on 12/19, which may be explaining her symptoms as well. -Cardiology consulted - will get NST/echo today -Initially on nitro gtt; switched to nitro SL/paste -F/u TTE -F/u CBC, CMP -A1c, lipid panel -Acetaminophen, Toradol 15mg  q6hrs PRN for pain -Zofran 4mg  q6hrs PRN for nausea  2. New-onset paroxysmal  atrial fibrillation - Captured on telemetry, correlated with her symptoms of tightness and palpitation. Max HR was 105, no RVR. Likely exacerbated by recent stressors, no h/o thyroid disease but will evaluate further. -Will need anticoagulation on discharge given her CHADS-VASc -F/u TSH -Consider low-dose beta blocker although has had bouts of bradycardia so will hold off for now  2. History of early-stage R-sided breast cancer -Continue home anastrazole  Dispo: Disposition is deferred at this time, awaiting improvement of current medical problems.  Anticipated discharge in approximately 1-2 day(s).   The patient does have a current PCP Cheryl Fenton, NP) and does need an [facility] hospital follow-up appointment after discharge.  The patient does not have transportation limitations that hinder transportation to clinic appointments.   LOS: 1 day   Norval Gable, MD 06/04/2015, 11:25 AM

## 2015-06-04 NOTE — Discharge Instructions (Addendum)
Mrs. Bardos,  We have tested your heart to find the cause of your chest pain. While your heart did not have any blockages, we did find that you had an irregular heart beat called atrial fibrillation (A-Fib) that comes and goes. We will have you follow-up with the heart doctors and your primary care doctor. The only new medication we would like you to take is called rivaroxaban (Xarelto) that will help prevent blood clots and strokes that can be caused by A-Fib.   Also, please take an increased dose of the acid reflux medication, now 40mg  once daily. I have put in both prescriptions to your pharmacy.  Please make an appointment with your primary care doctor at your earliest convenience for a hospital follow-up.  Finally, for your chest pain, you can take extra strength tylenol or ibuprofen as these should help at higher doses.  Thanks, Blane Ohara

## 2015-06-04 NOTE — Plan of Care (Signed)
Problem: Physical Regulation: Goal: Ability to maintain clinical measurements within normal limits will improve Outcome: Completed/Met Date Met:  06/04/15 Pt responding well to treatment. Pt educated on treatment plan.

## 2015-06-04 NOTE — Plan of Care (Signed)
Problem: Pain Managment: Goal: General experience of comfort will improve Outcome: Completed/Met Date Met:  06/04/15 Pt educated on pain scale and interventions. Pt verbalized understanding.

## 2015-06-05 DIAGNOSIS — R0789 Other chest pain: Secondary | ICD-10-CM | POA: Diagnosis not present

## 2015-06-05 DIAGNOSIS — I480 Paroxysmal atrial fibrillation: Secondary | ICD-10-CM | POA: Diagnosis not present

## 2015-06-05 LAB — HEMOGLOBIN A1C
Hgb A1c MFr Bld: 7.2 % — ABNORMAL HIGH (ref 4.8–5.6)
MEAN PLASMA GLUCOSE: 160 mg/dL

## 2015-06-05 MED ORDER — ACETAMINOPHEN 500 MG PO TABS
500.0000 mg | ORAL_TABLET | Freq: Four times a day (QID) | ORAL | Status: DC | PRN
Start: 2015-06-05 — End: 2015-07-04

## 2015-06-05 MED ORDER — IBUPROFEN 600 MG PO TABS: 600.0000 mg | ORAL_TABLET | Freq: Four times a day (QID) | ORAL | Status: DC | PRN

## 2015-06-05 NOTE — Care Management Obs Status (Signed)
[place] NOTIFICATION   Patient Details  Name: AAHNA PANETO MRN: [id number] Date of Birth: [birth year]   Medicare Observation Status Notification Given:  Yes    Bethena Roys, RN 06/05/2015, 9:08 AM

## 2015-06-05 NOTE — Progress Notes (Signed)
Subjective: Cheryl Gray had no more episodes of atrial fibrillation overnight. She says that her chest pain is improved but she still has chest wall soreness.  Objective: Vital signs in last 24 hours: Filed Vitals:   06/04/15 1422 06/04/15 2004 06/05/15 0500 06/05/15 0844  BP: 113/46 111/62 104/64 139/78  Pulse: 85 59 45   Temp: 97.8 F (36.6 C) 97.8 F (36.6 C) 98.4 F (36.9 C)   TempSrc: Oral Oral Oral   Resp:  18 18   Height:      Weight:   202 lb 3.2 oz (91.717 kg)   SpO2: 99% 97% 97%    Weight change: 2 lb 3.2 oz (0.998 kg)  Intake/Output Summary (Last 24 hours) at 06/05/15 1301 Last data filed at 06/05/15 [id number]  Gross per 24 hour  Intake    240 ml  Output    150 ml  Net     90 ml     Gen: Well-appearing, alert and oriented to person, place, and time HEENT: Oropharynx clear without erythema or exudate.  Neck: No cervical LAD, no thyromegaly or nodules, no JVD noted. CV: Normal rate, regular rhythm, grade 2/6 systolic ejection murmur heard best at the LSB, no rubs, or gallops.  Pulmonary: Normal effort, CTA bilaterally, no wheezing, rales, or rhonchi Abdominal: Soft, non-tender, non-distended, without rebound, guarding, or masses Extremities: Distal pulses 2+ in upper and lower extremities bilaterally, no tenderness, erythema or edema Skin: No atypical appearing moles. No rashes  Lab Results: Basic Metabolic Panel:  Recent Labs Lab 06/03/15 1200 06/04/15 0500  NA 136 137  K 4.3 3.6  CL 100* 103  CO2 24 24  GLUCOSE 111* 125*  BUN 25* 24*  CREATININE 0.90 0.85  CALCIUM 9.4 9.0   Liver Function Tests:  Recent Labs Lab 06/04/15 0500  AST 16  ALT 18  ALKPHOS 67  BILITOT 0.7  PROT 6.3*  ALBUMIN 3.4*   CBC:  Recent Labs Lab 06/03/15 1200 06/04/15 0400  WBC 7.4 4.9  HGB 12.7 12.2  HCT 40.0 38.2  MCV 92.4 92.3  PLT 289 246   Cardiac Enzymes:  Recent Labs Lab 06/03/15 2127 06/04/15 0400  TROPONINI <0.03 <0.03   Fasting Lipid  Panel:  Recent Labs Lab 06/03/15 2127  CHOL 157  HDL 45  LDLCALC 89  TRIG 113  CHOLHDL 3.5   Studies/Results: Nm Myocar Multi W/spect W/wall Motion / Ef  06/04/2015  CLINICAL DATA:  First EXAM: MYOCARDIAL IMAGING WITH SPECT (REST AND PHARMACOLOGIC-STRESS) GATED LEFT VENTRICULAR WALL MOTION STUDY LEFT VENTRICULAR EJECTION FRACTION TECHNIQUE: Standard myocardial SPECT imaging was performed after resting intravenous injection of eft mCi Tc-21m sestamibi. Subsequently, intravenous infusion of Lexiscan was performed under the supervision of the Cardiology staff. At peak effect of the drug, 30 mCi Tc-23m sestamibi was injected intravenously and standard myocardial SPECT imaging was performed. Quantitative gated imaging was also performed to evaluate left ventricular wall motion, and estimate left ventricular ejection fraction. COMPARISON:  None. FINDINGS: Perfusion: No decreased activity in the left ventricle on stress imaging to suggest reversible ischemia or infarction. Wall Motion: Normal left ventricular wall motion. No left ventricular dilation. Left Ventricular Ejection Fraction: 78 % End diastolic volume 63 ml End systolic volume 14 ml IMPRESSION: 1. No reversible ischemia or infarction. 2. Normal left ventricular wall motion. 3. Left ventricular ejection fraction 78% 4. Low-risk stress test findings*. *2012 Appropriate Use Criteria for Coronary Revascularization Focused Update: J Am Coll Cardiol. N6492421. http://content.[email or web address].aspx?articleid=1201161 Electronically Signed   By:  Lucrezia Europe M.D.   On: 06/04/2015 14:26   Assessment/Plan: Active Problems:   Chest pain   Pain in the chest   Paroxysmal atrial fibrillation (Cheryl Gray) 1. Atypical chest pain - Does have recent h/o multiple breast reconstruction surgeries, latest ~6 months ago. No h/o radiation. Troponins initially negative, initial EKG on 12/18 showed nonspecific T-wave abnormalities in anterior leads. Only  partially relieved by nitro, ASA, and GI cocktail. However, does have several cardiac RFs, including past history of smoking, HTN, HLD, family history, and obesity. Most likely stress-induced vs GERD, however must consider atypical anginal chest pain given RFs. Tele did capture run of Afib on 12/19, which may be explaining her symptoms as well. NST was low-risk, TTE showed LA dilation otherwise normal. -Cardiology consulted -F/u CBC, CMP -A1c, lipid panel -Acetaminophen, Toradol 15mg  q6hrs PRN for pain -Zofran 4mg  q6hrs PRN for nausea  2. New-onset paroxysmal atrial fibrillation - Captured on telemetry, correlated with her symptoms of tightness and palpitation. Max HR was 105, no RVR. Likely exacerbated by recent stressors, no h/o thyroid disease but will evaluate further. -Will need anticoagulation on discharge given her CHADS-VASc -F/u TSH -Hold off low-dose beta blocker as had bouts of bradycardia so will hold off for now, also not in RVR  2. History of early-stage R-sided breast cancer -Continue home anastrazole  Dispo: Disposition is deferred at this time, awaiting improvement of current medical problems.  Anticipated discharge in approximately 1-2 day(s).   The patient does have a current PCP Jearld Fenton, NP) and does need an [facility] hospital follow-up appointment after discharge.  The patient does not have transportation limitations that hinder transportation to clinic appointments.   LOS: 2 days   Norval Gable, MD 06/05/2015, 1:01 PM

## 2015-06-05 NOTE — Progress Notes (Signed)
Patient Name: Cheryl Gray Date of Encounter: 06/05/2015  Active Problems:   Chest pain   Pain in the chest   Paroxysmal atrial fibrillation [facility])    Primary Cardiologist: New - Dr. Marlou Porch Patient Profile: 69 yo female w/ PMH of HTN, HLD, Breast cancer, and GERD who presented to [facility] ED on 06/03/2015 for constant chest pain since earlier that morning. Cyclic troponin values have been negative. Noted to have paroxysmal AFib- asymptomatic.  SUBJECTIVE: no chest pain or SOB  OBJECTIVE Filed Vitals:   06/04/15 1422 06/04/15 2004 06/05/15 0500 06/05/15 0844  BP: 113/46 111/62 104/64 139/78  Pulse: 85 59 45   Temp: 97.8 F (36.6 C) 97.8 F (36.6 C) 98.4 F (36.9 C)   TempSrc: Oral Oral Oral   Resp:  18 18   Height:      Weight:   91.717 kg (202 lb 3.2 oz)   SpO2: 99% 97% 97%     Intake/Output Summary (Last 24 hours) at 06/05/15 1257 Last data filed at 06/05/15 0824  Gross per 24 hour  Intake    240 ml  Output    150 ml  Net     90 ml   Filed Weights   06/03/15 1159 06/04/15 0458 06/05/15 0500  Weight: 90.719 kg (200 lb) 92.126 kg (203 lb 1.6 oz) 91.717 kg (202 lb 3.2 oz)    PHYSICAL EXAM General: Well developed, well nourished, female in no acute distress. Head: Normocephalic, atraumatic.  Neck: Supple without bruits, JVD not elevated. Lungs:  Resp regular and unlabored, CTA without wheezing or rales. Heart: RRR, S1, S2, no S3, S4, 1/6 SEM at RUSB; no rub. Tender to palpation along left pectoral region. Abdomen: Soft, non-tender, non-distended with normoactive bowel sounds. No hepatomegaly. No rebound/guarding. No obvious abdominal masses. Extremities: No clubbing, cyanosis, or edema. Distal pedal pulses are 2+ bilaterally. Neuro: Alert and oriented X 3. Moves all extremities spontaneously. Psych: Normal affect.  LABS: CBC:  Recent Labs  06/03/15 1200 06/04/15 0400  WBC 7.4 4.9  HGB 12.7 12.2  HCT 40.0 38.2  MCV 92.4 92.3  PLT 289 246    INR:No results for input(s): INR in the last 72 hours. Basic Metabolic Panel:  Recent Labs  06/03/15 1200 06/04/15 0500  NA 136 137  K 4.3 3.6  CL 100* 103  CO2 24 24  GLUCOSE 111* 125*  BUN 25* 24*  CREATININE 0.90 0.85  CALCIUM 9.4 9.0   Liver Function Tests:  Recent Labs  06/04/15 0500  AST 16  ALT 18  ALKPHOS 67  BILITOT 0.7  PROT 6.3*  ALBUMIN 3.4*   Cardiac Enzymes:  Recent Labs  06/03/15 2127 06/04/15 0400  TROPONINI <0.03 <0.03    Recent Labs  06/03/15 1214 06/03/15 1548  TROPIPOC 0.01 0.00   Fasting Lipid Panel:  Recent Labs  06/03/15 2127  CHOL 157  HDL 45  LDLCALC 89  TRIG 113  CHOLHDL 3.5    TELE:  NSR         ECHO: Study Conclusions  - Left ventricle: The cavity size was normal. Systolic function was normal. The estimated ejection fraction was in the range of 55% to 60%. Wall motion was normal; there were no regional wall motion abnormalities. - Left atrium: The atrium was mildly dilated. - Atrial septum: No defect or patent foramen ovale was identified.  Radiology/Studies: Dg Chest 2 View: 06/03/2015  CLINICAL DATA:  Intermittent chest pain for week, chest tightness and  shortness of breath. History of hypertension. History of right-sided mastectomy 1 year ago. EXAM: CHEST  2 VIEW COMPARISON:  Chest x-ray dated 03/31/2014. FINDINGS: Heart size is normal. Overall cardiomediastinal silhouette remains normal in size and configuration. Lungs are clear. No evidence of pneumonia. No pleural effusion. No pneumothorax seen. Mild degenerative change again noted throughout the slightly kyphotic thoracic spine. Osseous structures about the chest are otherwise unremarkable. IMPRESSION: Lungs are clear and there is no evidence of acute cardiopulmonary abnormality. Electronically Signed   By: Franki Cabot M.D.   On: 06/03/2015 12:42    MYOCARDIAL IMAGING WITH SPECT (REST AND PHARMACOLOGIC-STRESS)  GATED LEFT VENTRICULAR WALL MOTION  STUDY  LEFT VENTRICULAR EJECTION FRACTION  TECHNIQUE: Standard myocardial SPECT imaging was performed after resting intravenous injection of eft mCi Tc-4m sestamibi. Subsequently, intravenous infusion of Lexiscan was performed under the supervision of the Cardiology staff. At peak effect of the drug, 30 mCi Tc-39m sestamibi was injected intravenously and standard myocardial SPECT imaging was performed. Quantitative gated imaging was also performed to evaluate left ventricular wall motion, and estimate left ventricular ejection fraction.  COMPARISON: None.  FINDINGS: Perfusion: No decreased activity in the left ventricle on stress imaging to suggest reversible ischemia or infarction.  Wall Motion: Normal left ventricular wall motion. No left ventricular dilation.  Left Ventricular Ejection Fraction: 78 %  End diastolic volume 63 ml  End systolic volume 14 ml  IMPRESSION: 1. No reversible ischemia or infarction.  2. Normal left ventricular wall motion.  3. Left ventricular ejection fraction 78%  4. Low-risk stress test findings*.  *2012 Appropriate Use Criteria for Coronary Revascularization Focused Update: J Am Coll Cardiol. N6492421. http://content.[email or web address].aspx?articleid=1201161   Electronically Signed  By: Lucrezia Europe M.D.  On: 06/04/2015 14:26  Current Medications:  . anastrozole  1 mg Oral Daily  . heparin  5,000 Units Subcutaneous 3 times per day  . lisinopril  20 mg Oral Daily   And  . hydrochlorothiazide  12.5 mg Oral Daily  . pantoprazole  40 mg Oral Daily  . simvastatin  10 mg Oral QHS  . sodium chloride  3 mL Intravenous Q12H  . vitamin B-12  1,000 mcg Oral Daily      ASSESSMENT AND PLAN:  1. Atypical Chest pain Ruled out for MI. Nuclear stress test and Echo unremarkable.   2. New Onset Atrial Fibrillation - This patients CHA2DS2-VASc Score and unadjusted Ischemic Stroke Rate (% per year) is equal to  3.2 % stroke rate/year from a score of 3 (HTN,  Female, Age). Will need to initiate anticoagulation.  - has episodes of bradycardia in the mid-40's at times. Would avoid AV nodal blocking agents. Rate is normal in Afib.   3. HTN - BP has been well controlled. - continue current medication regimen  4. HLD - continue statin  OK for DC today from our standpoint. Would favor NOAC. Husband is on Eliquis.  Signed, Owin Vignola [place], [place] 06/05/2015 12:57 PM

## 2015-06-05 NOTE — Discharge Summary (Signed)
Name: Cheryl Gray MRN: [id number] DOB: [birth year] 69 y.o. PCP: Cheryl Fenton, NP  Date of Admission: 06/03/2015  1:54 PM Date of Discharge: 06/05/2015 Attending Physician: No att. providers found  Discharge Diagnosis: 1. Atypical chest pain 2. Paroxysmal atrial fibrillation Active Problems:   Chest pain   Pain in the chest   Paroxysmal atrial fibrillation [facility])  Discharge Medications:   Medication List    TAKE these medications        acetaminophen 500 MG tablet  Commonly known as:  TYLENOL  Take 500 mg by mouth every 6 (six) hours as needed for mild pain, moderate pain or headache.     acetaminophen 500 MG tablet  Commonly known as:  TYLENOL  Take 1 tablet (500 mg total) by mouth every 6 (six) hours as needed.     anastrozole 1 MG tablet  Commonly known as:  ARIMIDEX  Take 1 mg by mouth daily.     ibuprofen 600 MG tablet  Commonly known as:  ADVIL,MOTRIN  Take 1 tablet (600 mg total) by mouth every 6 (six) hours as needed.     lisinopril-hydrochlorothiazide 20-12.5 MG tablet  Commonly known as:  PRINZIDE,ZESTORETIC  Take 1 tablet by mouth daily.     pantoprazole 40 MG tablet  Commonly known as:  PROTONIX  Take 1 tablet (40 mg total) by mouth daily.     RA VITAMIN B-12 TR 1000 MCG Tbcr  Generic drug:  Cyanocobalamin  Take 1,000 mcg by mouth daily.     rivaroxaban 20 MG Tabs tablet  Commonly known as:  XARELTO  Take 1 tablet (20 mg total) by mouth daily with supper.     simvastatin 10 MG tablet  Commonly known as:  ZOCOR  Take 1 tablet (10 mg total) by mouth at bedtime.     traMADol 50 MG tablet  Commonly known as:  ULTRAM  Take 50 mg by mouth every 6 (six) hours as needed for moderate pain or severe pain.        Disposition and follow-up:   Cheryl Gray was discharged from [facility] in Good condition.  At the hospital follow up visit please address:  1.  Resolution of chest pain, anticoagulation for Afib (put on  Xarelto)  2.  Labs / imaging needed at time of follow-up: None  3.  Pending labs/ test needing follow-up: None  Follow-up Appointments:     Follow-up Information    Follow up with Cheryl Furbish, MD.   Specialty:  Cardiology   Why:  office will contact you   Contact information:   [address] [address] [phone]       Schedule an appointment as soon as possible for a visit with Cheryl Silversmith, NP.   Specialty:  Internal Medicine   Contact information:   [place] [address] [phone]       Discharge Instructions: Discharge Instructions    Diet - low sodium heart healthy    Complete by:  As directed      Increase activity slowly    Complete by:  As directed            Consultations: Treatment Team:  Rounding L[facility]cardiology, MD  Procedures Performed:  Dg Chest 2 View  06/03/2015  CLINICAL DATA:  Intermittent chest pain for week, chest tightness and shortness of breath. History of hypertension. History of right-sided mastectomy 1 year ago. EXAM: CHEST  2  VIEW COMPARISON:  Chest x-ray dated 03/31/2014. FINDINGS: Heart size is normal. Overall cardiomediastinal silhouette remains normal in size and configuration. Lungs are clear. No evidence of pneumonia. No pleural effusion. No pneumothorax seen. Mild degenerative change again noted throughout the slightly kyphotic thoracic spine. Osseous structures about the chest are otherwise unremarkable. IMPRESSION: Lungs are clear and there is no evidence of acute cardiopulmonary abnormality. Electronically Signed   By: Cheryl Gray M.D.   On: 06/03/2015 12:42   Nm Myocar Multi W/spect W/wall Motion / Ef  06/04/2015  CLINICAL DATA:  First EXAM: MYOCARDIAL IMAGING WITH SPECT (REST AND PHARMACOLOGIC-STRESS) GATED LEFT VENTRICULAR WALL MOTION STUDY LEFT VENTRICULAR EJECTION FRACTION TECHNIQUE: Standard myocardial SPECT imaging was performed after resting intravenous injection of  eft mCi Tc-43m sestamibi. Subsequently, intravenous infusion of Lexiscan was performed under the supervision of the Cardiology staff. At peak effect of the drug, 30 mCi Tc-66m sestamibi was injected intravenously and standard myocardial SPECT imaging was performed. Quantitative gated imaging was also performed to evaluate left ventricular wall motion, and estimate left ventricular ejection fraction. COMPARISON:  None. FINDINGS: Perfusion: No decreased activity in the left ventricle on stress imaging to suggest reversible ischemia or infarction. Wall Motion: Normal left ventricular wall motion. No left ventricular dilation. Left Ventricular Ejection Fraction: 78 % End diastolic volume 63 ml End systolic volume 14 ml IMPRESSION: 1. No reversible ischemia or infarction. 2. Normal left ventricular wall motion. 3. Left ventricular ejection fraction 78% 4. Low-risk stress test findings*. *2012 Appropriate Use Criteria for Coronary Revascularization Focused Update: J Am Coll Cardiol. [id number]. http://content.airportbarriers.com.aspx?articleid=[email or web address] Electronically Signed   By: Cheryl Gray M.D.   On: 06/04/2015 14:26   2D Echo:    Admission HPI: Cheryl Gray is a 69yo F with PMH of HTN, HLD, GERD, Stage I left-sided breast cancer s/p mastectomy and 2 reconstruction surgeries ~6 months ago now on anastrazole, who presents with 1 week of progressively worsening squeezing chest pain under her left breast. She says this started one week ago, was intermittent coming in brief spells not associated with activity, but since yesterday the episodes became more intense and frequent, and this morning the pain became constant which prompted her to come in. She describes the pain as squeezing and pressure under the left breast, non-radiating, occasionally associated with palpitations and shortness of breath, but no other associated symptoms. She says that some pain medications she still had from her mastectomy did not  relieve the pain. She says that she has been under tremendous stress recently, with multiple deaths in the family and her cancer diagnosis. She also says her baseline GERD symptoms of indigestion have also been more frequent recently. She denies fevers, malaise, diaphoresis, cough, URI symptoms, abdominal pain, nausea, vomiting, urinary or bowel changes, numbness, weakness, tingling, recent travel or sick contacts.  In the ED, her vitals were normal, CXR was negative. Troponin was negative initially with 0.00 and 0.01 respectively. EKG showed non-specific T-wave abnormalities in the anterior leads. She was treated with ASA, SL nitro, a GI cocktail, and zofran, which helped intermittently.  Hospital Course by problem list: Active Problems:   Chest pain   Pain in the chest   Paroxysmal atrial fibrillation ([place])   1. Atypical chest pain - Patient presented with worsening L-sided chest pain over her breast. Troponins were serially negative, initial EKG showed non-specific T-wave abnormalities in the anterior leads. Her pain was essentially unchanged by nitro, ASA, and GI cocktail. Telemetry showed paroxysmal atrial fibrillation. Given her history of  several cardiac risk factors (smoking, HTN, HLD, family history), cardiology was consulted to do a nuclear stress test, which was low-risk. A TTE was completely normal except for left atrial dilation. Her chest pain resolved quickly over the course of the hospitalization, with toradol and acetaminophen providing the most relief. Her chest pain was felt to be musculoskeletal or post-surgical rather than cardiac given these findings and was sent home with NSAIDs and follow-up with cardiology.  2. Paroxysmal atrial fibrillation - Found incidentally on telemetry, with possible correlation with symptoms but this was hard to elicit as there were very few episodes captured on telemetry. Her maximum rate was 105, not in RVR. Given her CHADS-VASc of 3, she was placed on  daily Xarelto 20mg  for stroke prophylaxis. She was not placed on a beta blocker given not being in RVR and occasional bouts of bradycardia.  Discharge Vitals:   BP 139/78 mmHg  Pulse 45  Temp(Src) 98.4 F (36.9 C) (Oral)  Resp 18  Ht 5' (1.524 m)  Wt 202 lb 3.2 oz (91.717 kg)  BMI 39.49 kg/m2  SpO2 97%  LMP 06/17/1987  Discharge Labs:  No results found for this or any previous visit (from the past 24 hour(s)).  Signed: Norval Gable, MD 06/05/2015, 5:04 PM

## 2015-06-07 ENCOUNTER — Telehealth: Payer: Self-pay | Admitting: *Deleted

## 2015-06-07 ENCOUNTER — Telehealth: Payer: Self-pay | Admitting: Cardiology

## 2015-06-07 NOTE — Telephone Encounter (Signed)
New Message  Pt c/o Shortness Of Breath: STAT if SOB developed within the last 24 hours or pt is noticeably SOB on the phone  1. Are you currently SOB (can you hear that pt is SOB on the phone)? Yes.. Just a little  2. How long have you been experiencing SOB? With the last 24 hours 3. Are you SOB when sitting or when up moving around? Both  4.  Are you currently experiencing any other symptoms? Tightness in chest

## 2015-06-07 NOTE — Telephone Encounter (Addendum)
Patient called c/o fluttering in her chest and mild SOB. She st "it feels like I was in afib in the hospital."  She denies any other symptoms.  Patient has taken all her medications as directed.  On the phone, patient counted her pulse for 1 minute. HR = 59. She does not have a blood pressure cuff.  Patient was recently admitted to [facility] 12/18 for CP and GERD. Troponin draws, chest x-ray, ECHO and myoview were negative. Patient was found to be in atrial fibrillation on telemetry and she was started on Xarelto.  Upon discharge, her acid reflux medication was increased, she was instructed to take Tylenol or ibuprofen for further chest pain, and was told to make an appointment with her PCP.  Reviewed with Dr. Curt Bears. No new orders given. Rescheduled patient's appointment with Dr. Marlou Porch at an earlier date - 06/22/15. When the patient was called to reschedule, patient st she had a shooting pain in her R jaw that lasted three seconds. Again, Reviewed with Dr. Curt Bears. Reassured the patient that a 3 second shooting pain would not be from her heart. Also reassured her that her ECHO and stress test done 3 days ago were negative. Offered patient an OV with an extender early next week to be assessed earlier. Patient declined appointment. Instructed patient to check BP and HR daily and keep a log for Dr. Marlou Porch.  Patient agrees with treatment plan.

## 2015-06-07 NOTE — Telephone Encounter (Signed)
Error.  No longer [facility] patient.

## 2015-06-22 ENCOUNTER — Encounter: Payer: Medicare Other | Admitting: Cardiology

## 2015-07-02 ENCOUNTER — Encounter: Payer: Medicare Other | Admitting: Cardiology

## 2015-07-03 ENCOUNTER — Other Ambulatory Visit: Payer: Self-pay | Admitting: *Deleted

## 2015-07-04 ENCOUNTER — Encounter: Payer: Self-pay | Admitting: Cardiology

## 2015-07-04 ENCOUNTER — Ambulatory Visit (INDEPENDENT_AMBULATORY_CARE_PROVIDER_SITE_OTHER): Payer: Medicare Other | Admitting: Cardiology

## 2015-07-04 VITALS — BP 130/70 | HR 93 | Ht 60.0 in | Wt 203.4 lb

## 2015-07-04 DIAGNOSIS — R0789 Other chest pain: Secondary | ICD-10-CM

## 2015-07-04 DIAGNOSIS — Z7901 Long term (current) use of anticoagulants: Secondary | ICD-10-CM | POA: Diagnosis not present

## 2015-07-04 DIAGNOSIS — I480 Paroxysmal atrial fibrillation: Secondary | ICD-10-CM | POA: Diagnosis not present

## 2015-07-04 DIAGNOSIS — I10 Essential (primary) hypertension: Secondary | ICD-10-CM | POA: Diagnosis not present

## 2015-07-04 NOTE — Progress Notes (Signed)
Cardiology Office Note    Date:  07/04/2015   ID:  Cheryl Gray, DOB May 23, 1946, MRN [id number]  PCP:  Rubie Maid, MD  Cardiologist:   Candee Furbish, MD   Chief Complaint  Patient presents with  . Follow-up    f/u afib in hsp  . Establish Care    History of Present Illness:  Cheryl Gray is a 70 y.o. female here for post hospital follow-up found to have asymptomatic paroxysmal atrial fibrillation, atypical chest pain, troponin negative with history of breast cancer, GERD, hypertension, hyperlipidemia.   during hospitalization, nuclear stress test was reassuring with no ischemia. She did demonstrate occasional episodes of bradycardia in the mid 40s at times. No AV nodal blocking agents were utilized. Rate is normal in atrial fibrillation. Her husband is on Eliquis.  Week after X-MAS, URI, ABX cough. Rare CP. Pressure, pain, ibuprofen helped. Did it for 1st week after getting home. Felt heart speeding up. Now may feel the AFIB.    today she feels much better in relation to her upper respiratory infection after 2 rounds of antibiotics. She still battling lingering effects of this infection however. She's had some minimal bruising with use of Xarelto.   Past Medical History  Diagnosis Date  . Hypertension   . Chicken pox   . Bronchitis   . GERD (gastroesophageal reflux disease)   . Thyroid condition   . Hyperlipidemia   . Cancer [facility])     Breast cancer    Past Surgical History  Procedure Laterality Date  . Abdominal hysterectomy  1989  . Appendectomy  1975  . Cholecystectomy  1975  . Shoulder surgery Left 2005  . Breast surgery  Breast lift    Outpatient Prescriptions Prior to Visit  Medication Sig Dispense Refill  . acetaminophen (TYLENOL) 500 MG tablet Take 500 mg by mouth every 6 (six) hours as needed for mild pain, moderate pain or headache.    . anastrozole (ARIMIDEX) 1 MG tablet Take 1 mg by mouth daily.    . Cyanocobalamin (RA VITAMIN B-12 TR)  1000 MCG TBCR Take 1,000 mcg by mouth daily.    Marland Kitchen lisinopril-hydrochlorothiazide (PRINZIDE,ZESTORETIC) 20-12.5 MG per tablet Take 1 tablet by mouth daily. 90 tablet 0  . pantoprazole (PROTONIX) 40 MG tablet Take 1 tablet (40 mg total) by mouth daily. 30 tablet 3  . rivaroxaban (XARELTO) 20 MG TABS tablet Take 1 tablet (20 mg total) by mouth daily with supper. 30 tablet 3  . simvastatin (ZOCOR) 10 MG tablet Take 1 tablet (10 mg total) by mouth at bedtime. 90 tablet 3  . acetaminophen (TYLENOL) 500 MG tablet Take 1 tablet (500 mg total) by mouth every 6 (six) hours as needed. 30 tablet 0  . ibuprofen (ADVIL,MOTRIN) 600 MG tablet Take 1 tablet (600 mg total) by mouth every 6 (six) hours as needed. 30 tablet 0  . traMADol (ULTRAM) 50 MG tablet Take 50 mg by mouth every 6 (six) hours as needed for moderate pain or severe pain.     No facility-administered medications prior to visit.     Allergies:   Codeine; Latex; and Tape   Social History   Social History  . Marital Status: Single    Spouse Name: N/A  . Number of Children: 3   . Years of Education: 9   Occupational History  . Retired    Social History Main Topics  . Smoking status: Former Research scientist (life sciences)  . Smokeless tobacco: None  . Alcohol Use:  No  . Drug Use: No  . Sexual Activity: Not Currently   Other Topics Concern  . None   Social History Narrative     Family History:  The patient's family history includes Cancer in her father; Heart disease in her brother; Hyperlipidemia in her father and mother; Hypertension in her father and mother.   ROS:   Please see the history of present illness.    ROS All other systems reviewed and are negative.   PHYSICAL EXAM:   VS:  BP 130/70 mmHg  Pulse 93  Ht 5' (1.524 m)  Wt 203 lb 6.4 oz (92.262 kg)  BMI 39.72 kg/m2  SpO2 96%  LMP 06/17/1987   GEN: Well nourished, well developed, in no acute distress HEENT: normal Neck: no JVD, carotid bruits, or masses Cardiac: RRR; no murmurs,  rubs, or gallops,no edema  Respiratory:  clear to auscultation bilaterally, normal work of breathing GI: soft, nontender, nondistended, + BS overweight MS: no deformity or atrophy Skin: warm and dry, no rash Neuro:  Alert and Oriented x 3, Strength and sensation are intact Psych: euthymic mood, full affect  Wt Readings from Last 3 Encounters:  07/04/15 203 lb 6.4 oz (92.262 kg)  06/05/15 202 lb 3.2 oz (91.717 kg)  05/27/13 200 lb (90.719 kg)      Studies/Labs Reviewed:   EKG:  06/03/15 -  Sinus rhythm/sinus tachycardia, no other ST segment changes. Personally reviewed. Telemetry in hospital showed paroxysmal atrial fibrillation episodes.  Recent Labs: 06/04/2015: ALT 18; BUN 24*; Creatinine, Ser 0.85; Hemoglobin 12.2; Platelets 246; Potassium 3.6; Sodium 137; TSH 0.768   Lipid Panel    Component Value Date/Time   CHOL 157 06/03/2015 2127   TRIG 113 06/03/2015 2127   HDL 45 06/03/2015 2127   CHOLHDL 3.5 06/03/2015 2127   VLDL 23 06/03/2015 2127   [place] 89 06/03/2015 2127   LDLDIRECT 143.2 01/24/2013 1519    Additional studies/ records that were reviewed today include:   ECHO 2016: Study Conclusions  - Left ventricle: The cavity size was normal. Systolic function was normal. The estimated ejection fraction was in the range of 55% to 60%. Wall motion was normal; there were no regional wall motion abnormalities. - Left atrium: The atrium was mildly dilated. - Atrial septum: No defect or patent foramen ovale was identified.  NUC stress 2016: IMPRESSION: 1. No reversible ischemia or infarction.  2. Normal left ventricular wall motion.  3. Left ventricular ejection fraction 78%  4. Low-risk stress test findings*.   ASSESSMENT:    1. Paroxysmal atrial fibrillation (HCC)   2. Essential hypertension, benign   3. Other chest pain   4. Chronic anticoagulation      PLAN:  In order of problems listed above:  1.  Paroxysmal atrial fibrillation -  CHADS-VASc 3 for hypertension, female, age. She is doing well with Xarelto. She feels as though she can sense her atrial fibrillation. She has not been having frequent episodes. We're avoiding AV nodal blocking agent such as metoprolol or diltiazem because of her bradycardia seen in the hospital setting. Currently doing well. Continue with anticoagulation. 2.  Blood pressure currently under good control, medications reviewed. 3.  Low risk nuclear stress test, reassuring. If symptoms worsen or become more worrisome, diagnostic angiogram could be considered. 4.  Continue with Xarelto. Creatinine, hemoglobin should be monitored every 6 months. 5.  cough/upper respiratory infection-try Mucinex. Try to avoid decongestants such as Sudafed.    Medication Adjustments/Labs and Tests Ordered: Current medicines  are reviewed at length with the patient today.  Concerns regarding medicines are outlined above.  Medication changes, Labs and Tests ordered today are listed in the Patient Instructions below. There are no Patient Instructions on file for this visit.     Bobby Rumpf, MD  07/04/2015 10:53 AM    [facility] [place], [place], [place]  [address] Phone: [phone]; Fax: [phone]

## 2015-07-04 NOTE — Patient Instructions (Signed)
Medication Instructions:  You may use Mucinex as directed for your cough.  If this does not improve please follow up with your primary care doctor for further evaluation. Continue all other medications as listed.  Follow-Up: Follow up in 6 months with Dr. Marlou Porch.  You will receive a letter in the mail 2 months before you are due.  Please call us when you receive this letter to schedule your follow up appointment.  If you need a refill on your cardiac medications before your next appointment, please call your pharmacy.  Thank you for choosing [facility]!!

## 2015-09-28 ENCOUNTER — Other Ambulatory Visit: Payer: Self-pay | Admitting: *Deleted

## 2015-09-28 MED ORDER — RIVAROXABAN 20 MG PO TABS: 20.0000 mg | ORAL_TABLET | Freq: Every day | ORAL | Status: DC

## 2015-10-17 ENCOUNTER — Other Ambulatory Visit: Payer: Self-pay | Admitting: Internal Medicine

## 2015-12-24 ENCOUNTER — Encounter: Payer: Self-pay | Admitting: Cardiology

## 2016-01-14 ENCOUNTER — Ambulatory Visit: Payer: Medicare Other | Admitting: Cardiology

## 2016-03-14 ENCOUNTER — Ambulatory Visit: Payer: Medicare Other | Admitting: Cardiology

## 2016-04-24 ENCOUNTER — Ambulatory Visit: Payer: Medicare Other | Admitting: Cardiology

## 2016-07-02 ENCOUNTER — Ambulatory Visit: Payer: Medicare Other | Admitting: Cardiology

## 2016-08-01 ENCOUNTER — Other Ambulatory Visit: Payer: Self-pay | Admitting: Cardiology

## 2016-08-05 ENCOUNTER — Ambulatory Visit (INDEPENDENT_AMBULATORY_CARE_PROVIDER_SITE_OTHER): Payer: Medicare HMO | Admitting: Cardiology

## 2016-08-05 ENCOUNTER — Encounter: Payer: Self-pay | Admitting: Cardiology

## 2016-08-05 ENCOUNTER — Encounter (INDEPENDENT_AMBULATORY_CARE_PROVIDER_SITE_OTHER): Payer: Self-pay

## 2016-08-05 VITALS — BP 108/74 | HR 86 | Ht 60.0 in | Wt 206.0 lb

## 2016-08-05 DIAGNOSIS — Z7901 Long term (current) use of anticoagulants: Secondary | ICD-10-CM

## 2016-08-05 DIAGNOSIS — I480 Paroxysmal atrial fibrillation: Secondary | ICD-10-CM

## 2016-08-05 DIAGNOSIS — I10 Essential (primary) hypertension: Secondary | ICD-10-CM

## 2016-08-05 MED ORDER — RIVAROXABAN 20 MG PO TABS: 20.0000 mg | ORAL_TABLET | Freq: Every day | ORAL | 3 refills | Status: DC

## 2016-08-05 MED ORDER — LISINOPRIL-HYDROCHLOROTHIAZIDE 20-12.5 MG PO TABS: 1.0000 | ORAL_TABLET | Freq: Every day | ORAL | 3 refills | Status: DC

## 2016-08-05 NOTE — Patient Instructions (Signed)
Medication Instructions:  The current medical regimen is effective;  continue present plan and medications.  Labwork: Please have blood work today (CBC/BMP)  Follow-Up: Follow up in 6 months with Bonney Leitz, PA.  You will receive a letter in the mail 2 months before you are due.  Please call us when you receive this letter to schedule your follow up appointment.  If you need a refill on your cardiac medications before your next appointment, please call your pharmacy.  Thank you for choosing [facility]!!

## 2016-08-05 NOTE — Progress Notes (Signed)
Cardiology Office Note    Date:  08/05/2016   ID:  Cheryl Gray, DOB 1946/02/21, MRN [id number]  PCP:  Rubie Maid, MD  Cardiologist:   Candee Furbish, MD   No chief complaint on file.   History of Present Illness:  Cheryl Gray is a 71 y.o. female here forParoxysmal atrial fibrillation follow-up. Originally, her atrial fibrillation was asymptomatic. She will occasionally feel palpitations however. She has had atypical chest pain, troponin negative with history of breast cancer, GERD, hypertension, hyperlipidemia.  During hospitalization, nuclear stress test was reassuring with no ischemia. She did demonstrate occasional episodes of bradycardia in the mid 40s at times. No AV nodal blocking agents were utilized. Rate is normal in atrial fibrillation. Her husband is on Eliquis.  No chest pain, syncope, bleeding, orthopnea, PND   Past Medical History:  Diagnosis Date  . Bronchitis   . Cancer [facility])    Breast cancer  . Chicken pox   . GERD (gastroesophageal reflux disease)   . Hyperlipidemia   . Hypertension   . Thyroid condition     Past Surgical History:  Procedure Laterality Date  . ABDOMINAL HYSTERECTOMY  1989  . APPENDECTOMY  1975  . BREAST SURGERY  Breast lift  . CHOLECYSTECTOMY  1975  . SHOULDER SURGERY Left 2005    Outpatient Medications Prior to Visit  Medication Sig Dispense Refill  . acetaminophen (TYLENOL) 500 MG tablet Take 500 mg by mouth every 6 (six) hours as needed for mild pain, moderate pain or headache.    . anastrozole (ARIMIDEX) 1 MG tablet Take 1 mg by mouth daily.    Marland Kitchen ibuprofen (ADVIL,MOTRIN) 600 MG tablet Take 600 mg by mouth every 6 (six) hours as needed for fever, headache, mild pain, moderate pain or cramping.    . pantoprazole (PROTONIX) 40 MG tablet Take 1 tablet (40 mg total) by mouth daily. 30 tablet 3  . simvastatin (ZOCOR) 10 MG tablet Take 1 tablet (10 mg total) by mouth at bedtime. 90 tablet 3  .  lisinopril-hydrochlorothiazide (PRINZIDE,ZESTORETIC) 20-12.5 MG per tablet Take 1 tablet by mouth daily. 90 tablet 0  . rivaroxaban (XARELTO) 20 MG TABS tablet Take 1 tablet (20 mg total) by mouth daily with supper. 30 tablet 8  . Cyanocobalamin (RA VITAMIN B-12 TR) 1000 MCG TBCR Take 1,000 mcg by mouth daily.     No facility-administered medications prior to visit.      Allergies:   Codeine; Latex; and Tape   Social History   Social History  . Marital status: Single    Spouse name: N/A  . Number of children: 3   . Years of education: 9   Occupational History  . Retired    Social History Main Topics  . Smoking status: Former Research scientist (life sciences)  . Smokeless tobacco: Never Used  . Alcohol use No  . Drug use: No  . Sexual activity: Not Currently   Other Topics Concern  . None   Social History Narrative  . None     Family History:  The patient's family history includes Cancer in her father; Heart disease in her brother; Hyperlipidemia in her father and mother; Hypertension in her father and mother.   ROS:   Please see the history of present illness.    ROS All other systems reviewed and are negative.   PHYSICAL EXAM:   VS:  BP 108/74   Pulse 86   Ht 5' (1.524 m)   Wt 206 lb (93.4 kg)  LMP 06/17/1987   BMI 40.23 kg/m    GEN: Well nourished, well developed, in no acute distress  HEENT: normal  Neck: no JVD, carotid bruits, or masses Cardiac: RRR; 1/6 SEM, no rubs, or gallops,no edema  Respiratory:  clear to auscultation bilaterally, normal work of breathing GI: soft, nontender, nondistended, + BS overweight MS: no deformity or atrophy  Skin: warm and dry, no rash Neuro:  Alert and Oriented x 3, Strength and sensation are intact Psych: euthymic mood, full affect  Wt Readings from Last 3 Encounters:  08/05/16 206 lb (93.4 kg)  07/04/15 203 lb 6.4 oz (92.3 kg)  06/05/15 202 lb 3.2 oz (91.7 kg)      Studies/Labs Reviewed:   EKG:  Today 08/05/16-sinus rhythm 73 with no  other abnormalities personally viewed-prior 06/03/15 -  Sinus rhythm/sinus tachycardia, no other ST segment changes. Personally reviewed. Telemetry in hospital showed paroxysmal atrial fibrillation episodes.  Recent Labs: No results found for requested labs within last 8760 hours.   Lipid Panel    Component Value Date/Time   CHOL 157 06/03/2015 2127   TRIG 113 06/03/2015 2127   HDL 45 06/03/2015 2127   CHOLHDL 3.5 06/03/2015 2127   VLDL 23 06/03/2015 2127   LDLCALC 89 06/03/2015 2127   LDLDIRECT 143.2 01/24/2013 1519    Additional studies/ records that were reviewed today include:   ECHO 2016: Study Conclusions  - Left ventricle: The cavity size was normal. Systolic function was normal. The estimated ejection fraction was in the range of 55% to 60%. Wall motion was normal; there were no regional wall motion abnormalities. - Left atrium: The atrium was mildly dilated. - Atrial septum: No defect or patent foramen ovale was identified.  NUC stress 2016: IMPRESSION: 1. No reversible ischemia or infarction.  2. Normal left ventricular wall motion.  3. Left ventricular ejection fraction 78%  4. Low-risk stress test findings*.   ASSESSMENT:    1. Paroxysmal atrial fibrillation (HCC)   2. Essential hypertension, benign   3. Essential hypertension   4. Chronic anticoagulation   5. Hypertension, unspecified type      PLAN:  In order of problems listed above:  Paroxysmal atrial fibrillation - CHADS-VASc 3 for hypertension, female, age. She is doing well with Xarelto. She feels as though she can sense her atrial fibrillation. She will occasionally feel palpitations. We're avoiding AV nodal blocking agent such as metoprolol or diltiazem because of her bradycardia seen in the hospital setting. Currently doing well. Continue with anticoagulation.  Essential hypertension  - Blood pressure currently under good control, medications reviewed.  Low risk nuclear stress  test, reassuring. If symptoms worsen or become more worrisome, diagnostic angiogram could be considered.  Chronic anticoagulation-Continue with Xarelto. Creatinine, hemoglobin should be monitored every 6 months. Last blood work 10/2015 was normal.     Medication Adjustments/Labs and Tests Ordered: Current medicines are reviewed at length with the patient today.  Concerns regarding medicines are outlined above.  Medication changes, Labs and Tests ordered today are listed in the Patient Instructions below. Patient Instructions  Medication Instructions:  The current medical regimen is effective;  continue present plan and medications.  Labwork: Please have blood work today (CBC/BMP)  Follow-Up: Follow up in 6 months with Bonney Leitz, PA.  You will receive a letter in the mail 2 months before you are due.  Please call us when you receive this letter to schedule your follow up appointment.  If you need a refill on your cardiac medications  before your next appointment, please call your pharmacy.  Thank you for choosing [facility]!!          Signed, Candee Furbish, MD  08/05/2016 2:28 PM    [place], [place], [place]  [address] Phone: [phone]; Fax: [phone]

## 2016-08-06 ENCOUNTER — Telehealth: Payer: Self-pay | Admitting: Cardiology

## 2016-08-06 LAB — BASIC METABOLIC PANEL
BUN/Creatinine Ratio: 29 — ABNORMAL HIGH (ref 12–28)
BUN: 25 mg/dL (ref 8–27)
CO2: 25 mmol/L (ref 18–29)
CREATININE: 0.85 mg/dL (ref 0.57–1.00)
Calcium: 9.6 mg/dL (ref 8.7–10.3)
Chloride: 96 mmol/L (ref 96–106)
GFR calc Af Amer: 81 (ref >59)
GFR, EST NON AFRICAN AMERICAN: 71 (ref >59)
Glucose: 206 mg/dL — ABNORMAL HIGH (ref 65–99)
POTASSIUM: 4.1 mmol/L (ref 3.5–5.2)
SODIUM: 139 mmol/L (ref 134–144)

## 2016-08-06 LAB — CBC
HEMATOCRIT: 37 % (ref 34.0–46.6)
HEMOGLOBIN: 12.2 g/dL (ref 11.1–15.9)
MCH: 29.1 pg (ref 26.6–33.0)
MCHC: 33 g/dL (ref 31.5–35.7)
MCV: 88 fL (ref 79–97)
Platelets: 342 10*3/uL (ref 150–379)
RBC: 4.19 x10E6/uL (ref 3.77–5.28)
RDW: 13.7 % (ref 12.3–15.4)
WBC: 7.2 10*3/uL (ref 3.4–10.8)

## 2016-08-06 NOTE — Telephone Encounter (Signed)
New Message    Pt is returning your call regarding lab results

## 2016-08-06 NOTE — Telephone Encounter (Signed)
-----   Message from Jerline Pain, MD sent at 08/06/2016  6:15 AM EST ----- Excellent labs on Eliquis Candee Furbish, MD

## 2016-08-06 NOTE — Telephone Encounter (Signed)
Patient made aware of results. Patient verbalizes understanding.  

## 2016-08-28 ENCOUNTER — Telehealth: Payer: Self-pay | Admitting: Cardiology

## 2016-08-28 NOTE — Telephone Encounter (Signed)
New message    Request for surgical clearance:  1. What type of surgery is being performed? Left and right breast biopsy   2. When is this surgery scheduled? 09/26/16  3. Are there any medications that need to be held prior to surgery and how long? Xarelto how long should she hold it for ?  4. Name of physician performing surgery? Mark Arredondo   5. What is your office phone and fax number? Fax [phone]

## 2016-08-29 NOTE — Telephone Encounter (Signed)
Printed and  Taken to MR to be faxed.

## 2016-08-29 NOTE — Telephone Encounter (Signed)
She may hold her Xarelto for 2 days prior to surgery. Resume one day postop if okay with Dr. Adair Laundry. Candee Furbish, MD

## 2016-12-13 IMAGING — DX DG CHEST 2V
2 series · 2 of 2 positions shown · non-contrast
Comparison: Chest x-ray dated 03/31/2014.

CLINICAL DATA: Intermittent chest pain for week, chest tightness
and shortness of breath. History of hypertension. History of
right-sided mastectomy 1 year ago.

EXAM:
CHEST  2 VIEW

[chest pa]
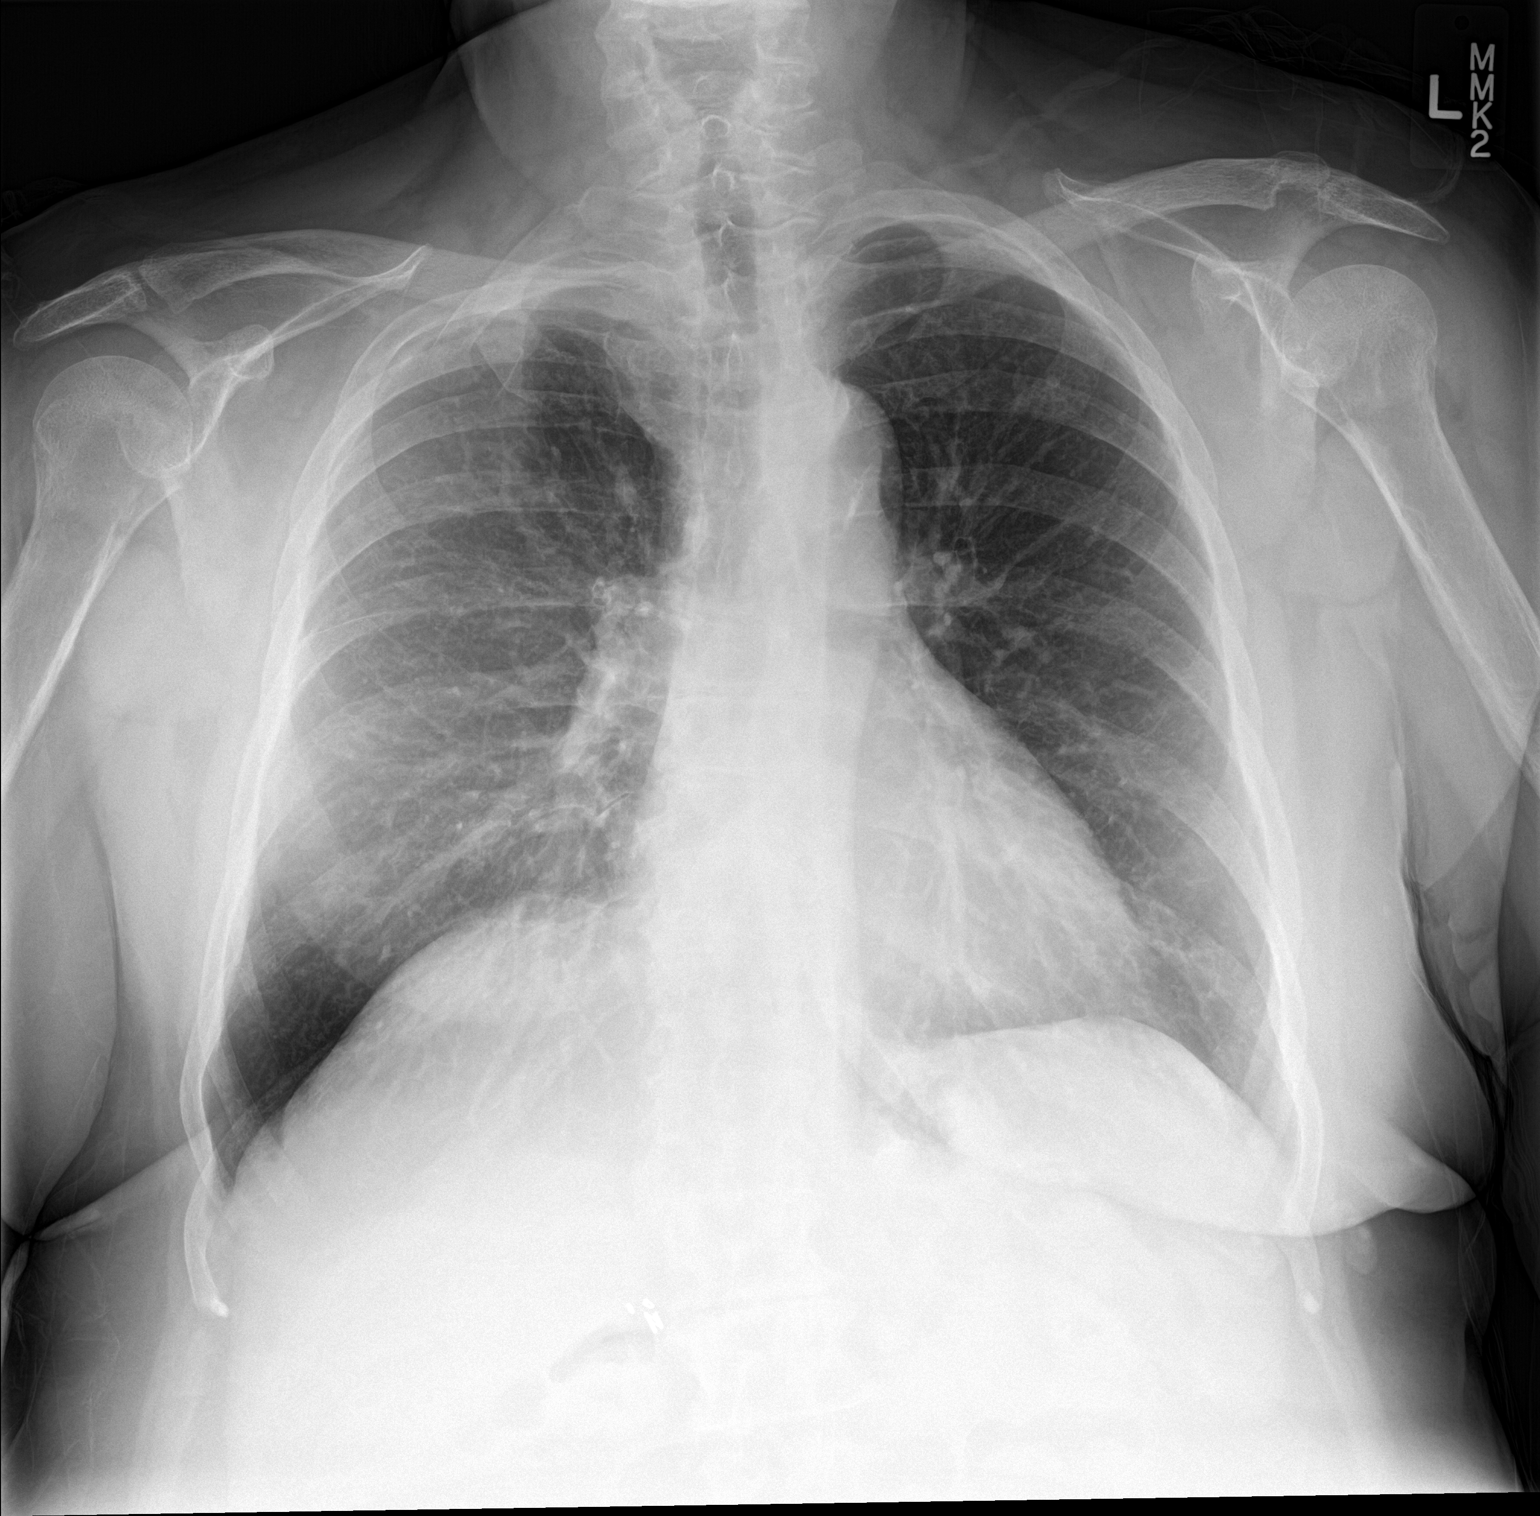

[chest lat]
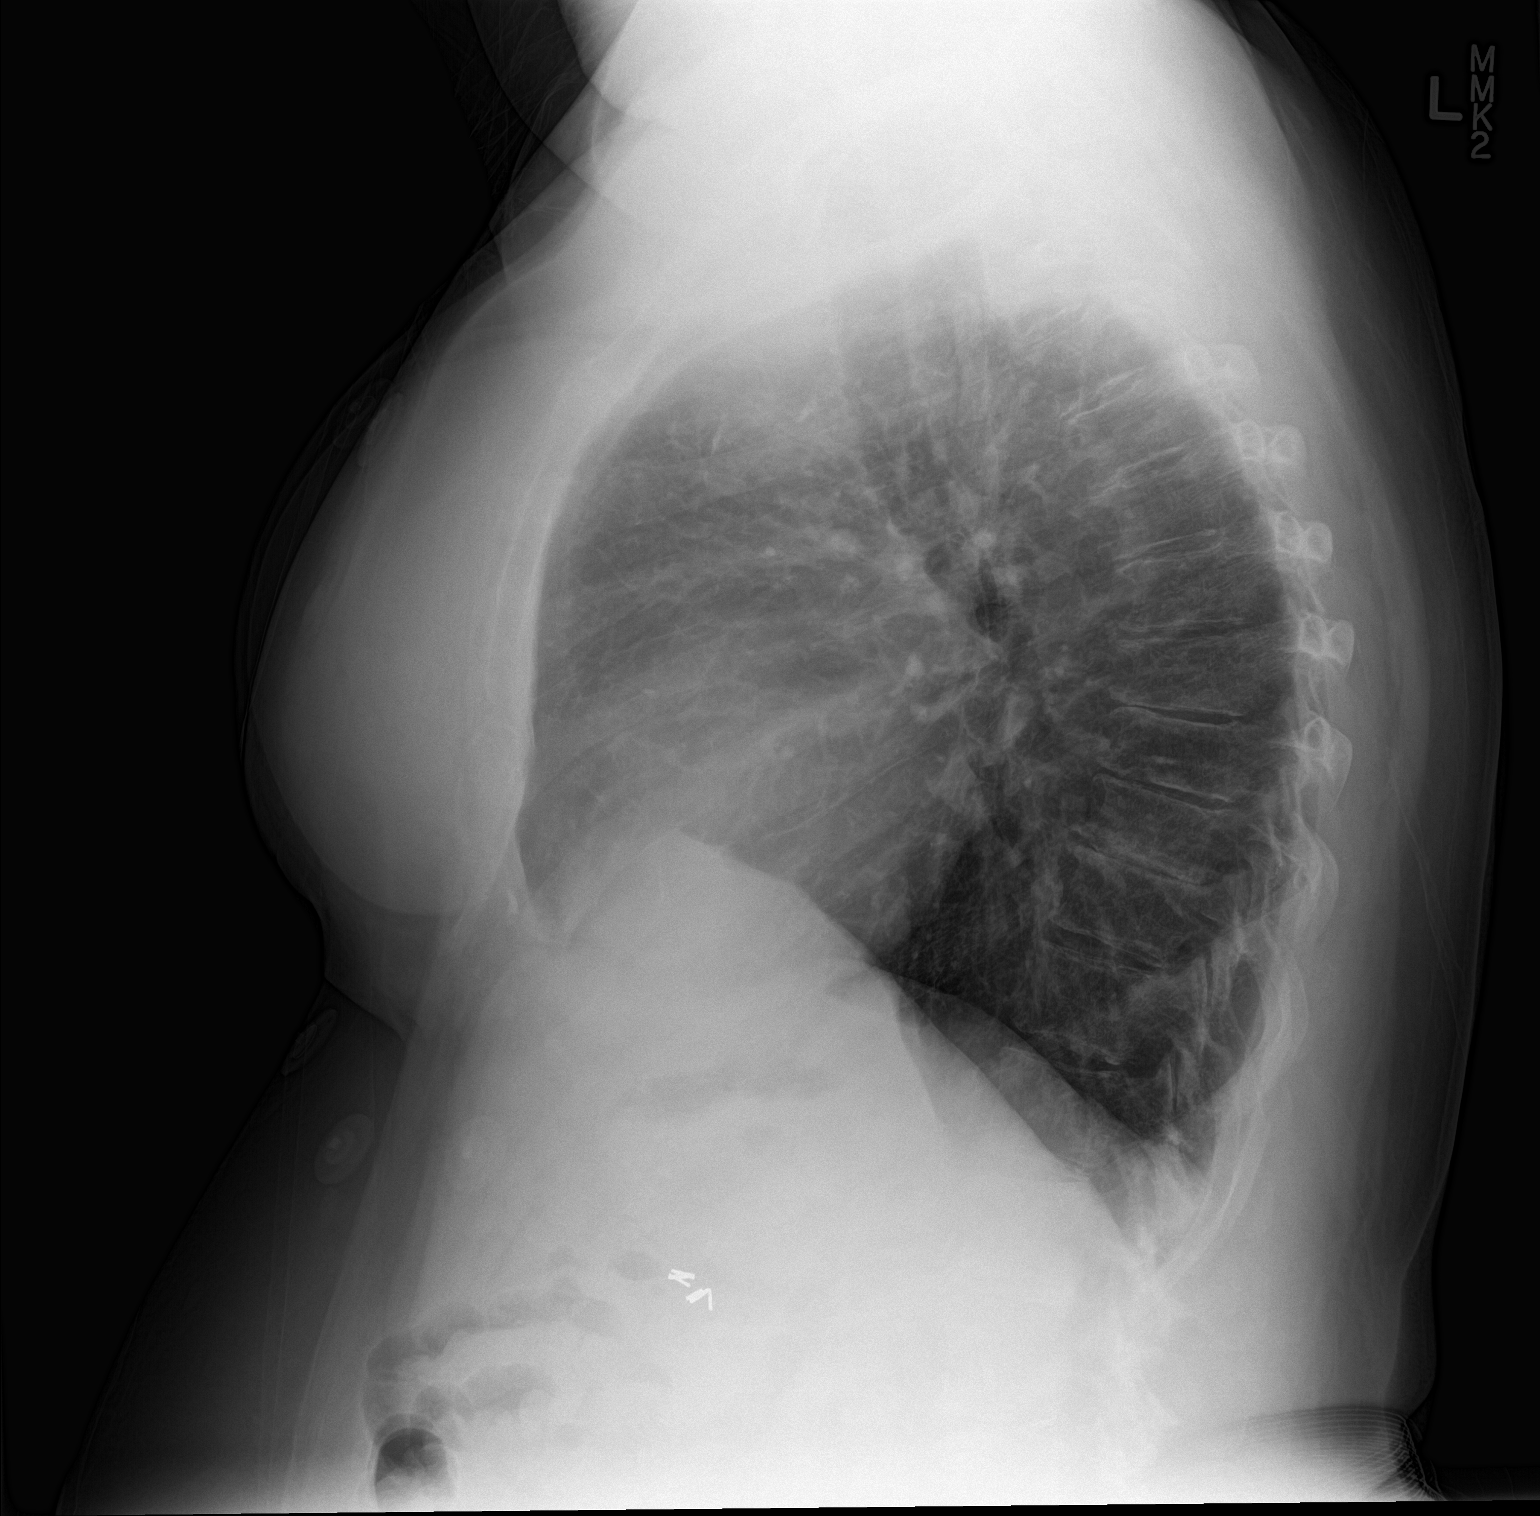

[2 of 2 positions shown; findings below may reference images not displayed]

FINDINGS: Heart size is normal. Overall cardiomediastinal silhouette remains
normal in size and configuration. Lungs are clear. No evidence of
pneumonia. No pleural effusion. No pneumothorax seen.

Mild degenerative change again noted throughout the slightly
kyphotic thoracic spine. Osseous structures about the chest are
otherwise unremarkable.
IMPRESSION: Lungs are clear and there is no evidence of acute cardiopulmonary
abnormality.

## 2017-05-04 ENCOUNTER — Encounter: Payer: Self-pay | Admitting: Nurse Practitioner

## 2017-05-04 ENCOUNTER — Ambulatory Visit (INDEPENDENT_AMBULATORY_CARE_PROVIDER_SITE_OTHER): Payer: Medicare HMO | Admitting: Nurse Practitioner

## 2017-05-04 VITALS — BP 114/68 | HR 72 | Ht 60.0 in | Wt 209.0 lb

## 2017-05-04 DIAGNOSIS — I480 Paroxysmal atrial fibrillation: Secondary | ICD-10-CM

## 2017-05-04 DIAGNOSIS — I10 Essential (primary) hypertension: Secondary | ICD-10-CM | POA: Diagnosis not present

## 2017-05-04 DIAGNOSIS — Z7901 Long term (current) use of anticoagulants: Secondary | ICD-10-CM

## 2017-05-04 NOTE — Patient Instructions (Addendum)
We will be checking the following labs today - NONE  Medication Instructions:    Continue with your current medicines.     Testing/Procedures To Be Arranged:  N/A  Follow-Up:   See me in 4 months Truitt Merle, NP 09/07/17 @ 10:30 AM     Other Special Instructions:  Here are my tips to lose weight:  1. Drink only water. You do not need milk, juice, tea, soda or diet soda.  2. Do not eat anything "white". This includes white bread, potatoes, rice or mayo  3. Stay away from fried foods and sweets  4. Your portion should be the size of the palm of your hand.  5. Know what your weaknesses are and avoid.   6. Find an exercise you like and do it every day for 45 to 60 minutes.           If you need a refill on your cardiac medications before your next appointment, please call your pharmacy.   Call the [place] office at [phone] if you have any questions, problems or concerns.

## 2017-05-04 NOTE — Progress Notes (Signed)
CARDIOLOGY OFFICE NOTE  Date:  05/04/2017    Eilene Ghazi Date of Birth: [birth year] Medical Record #[id number]  PCP:  Rubie Maid, MD  Cardiologist:  Marisa Cyphers    Chief Complaint  Patient presents with  . Atrial Fibrillation    Seen for Dr. Marlou Porch    History of Present Illness: MALINI FLEMINGS is a 71 y.o. female who presents today for a 9 month check. Seen for Dr. Marlou Porch.   She has a history of PAF, atypical chest pain (negative Myoview 2016), breast cancer, GERD, HTN, obesity and HLD. She is on Xarelto. She has had documented bradycardia with prior HR in the 40's while hospitalized back in 2016. She remains off of AV nodal blocking agents.   Last seen in February - she was felt to be doing well.   Comes in today. Here alone. Doing ok. She notes that her insurance company sent someone to do her annual assessment - thought she might have been in AF. She was not aware. She remains on her Xarelto - recent labs noted in [place]. She does bruise easily. She feels like she is doing about the same. She will have shortness of breath if she really exerts. Very rare chest pain. She feels like she is doing about the same as when she was last here. Still struggling with her weight - she loves diet Dr. Malachi Bonds - does not quantify how much. She has been successful in the past with losing weight by drinking water, not eating after 6pm, etc. Tolerating her medicines and overall, she feels like she is doing ok. She is being worked up for elevated glucose.   Past Medical History:  Diagnosis Date  . Bronchitis   . Cancer [facility])    Breast cancer  . Chicken pox   . GERD (gastroesophageal reflux disease)   . Hyperlipidemia   . Hypertension   . Thyroid condition     Past Surgical History:  Procedure Laterality Date  . ABDOMINAL HYSTERECTOMY  1989  . APPENDECTOMY  1975  . BREAST SURGERY  Breast lift  . CHOLECYSTECTOMY  1975  . SHOULDER SURGERY Left 2005      Medications: Current Meds  Medication Sig  . acetaminophen (TYLENOL) 500 MG tablet Take 500 mg by mouth every 6 (six) hours as needed for mild pain, moderate pain or headache.  . anastrozole (ARIMIDEX) 1 MG tablet Take 1 mg by mouth daily.  Marland Kitchen gabapentin (NEURONTIN) 100 MG capsule Take 100 mg 3 (three) times daily as needed by mouth.  Marland Kitchen ibuprofen (ADVIL,MOTRIN) 600 MG tablet Take 600 mg by mouth every 6 (six) hours as needed for fever, headache, mild pain, moderate pain or cramping.  Marland Kitchen lisinopril-hydrochlorothiazide (PRINZIDE,ZESTORETIC) 20-12.5 MG tablet Take 1 tablet by mouth daily.  . Multiple Vitamins-Minerals (PRESERVISION AREDS 2 PO) Take 1 capsule 2 (two) times daily by mouth.  . pantoprazole (PROTONIX) 40 MG tablet Take 1 tablet (40 mg total) by mouth daily.  . rivaroxaban (XARELTO) 20 MG TABS tablet Take 1 tablet (20 mg total) by mouth daily with supper.  . simvastatin (ZOCOR) 10 MG tablet Take 1 tablet (10 mg total) by mouth at bedtime.     Allergies: Allergies  Allergen Reactions  . Codeine     hallucinations.  . Latex Rash    Other  . Tape Rash and Other (See Comments)    Blisters skin if on too long     Social History: The patient  reports that she has quit smoking. she has never used smokeless tobacco. She reports that she does not drink alcohol or use drugs.   Family History: The patient's family history includes Cancer in her father; Heart disease in her brother; Hyperlipidemia in her father and mother; Hypertension in her father and mother.   Review of Systems: Please see the history of present illness.   Otherwise, the review of systems is positive for none.   All other systems are reviewed and negative.   Physical Exam: VS:  BP 114/68   Pulse 72   Ht 5' (1.524 m)   Wt 209 lb (94.8 kg)   LMP 06/17/1987   BMI 40.82 kg/m  .  BMI Body mass index is 40.82 kg/m.  Wt Readings from Last 3 Encounters:  05/04/17 209 lb (94.8 kg)  08/05/16 206 lb (93.4  kg)  07/04/15 203 lb 6.4 oz (92.3 kg)    General: Pleasant. Obese. Alert and in no acute distress.   HEENT: Normal.  Neck: Supple, no JVD, carotid bruits, or masses noted.  Cardiac: Regular rate and rhythm. Very soft outflow murmur noted. No edema.  Respiratory:  Lungs are clear to auscultation bilaterally with normal work of breathing.  GI: Soft and nontender.  MS: No deformity or atrophy. Gait and ROM intact.  Skin: Warm and dry. Color is normal.  Neuro:  Strength and sensation are intact and no gross focal deficits noted.  Psych: Alert, appropriate and with normal affect.   LABORATORY DATA:  EKG:  EKG is not ordered today.  Lab Results  Component Value Date   WBC 7.2 08/05/2016   HGB 12.2 08/05/2016   HCT 37.0 08/05/2016   PLT 342 08/05/2016   GLUCOSE 206 (H) 08/05/2016   CHOL 157 06/03/2015   TRIG 113 06/03/2015   HDL 45 06/03/2015   LDLDIRECT 143.2 01/24/2013   LDLCALC 89 06/03/2015   ALT 18 06/04/2015   AST 16 06/04/2015   NA 139 08/05/2016   K 4.1 08/05/2016   CL 96 08/05/2016   CREATININE 0.85 08/05/2016   BUN 25 08/05/2016   CO2 25 08/05/2016   TSH 0.768 06/04/2015   HGBA1C 7.2 (H) 06/03/2015     BNP (last 3 results) No results for input(s): BNP in the last 8760 hours.  ProBNP (last 3 results) No results for input(s): PROBNP in the last 8760 hours.   Other Studies Reviewed Today:  ECHO 2016: Study Conclusions  - Left ventricle: The cavity size was normal. Systolic function was normal. The estimated ejection fraction was in the range of 55% to 60%. Wall motion was normal; there were no regional wall motion abnormalities. - Left atrium: The atrium was mildly dilated. - Atrial septum: No defect or patent foramen ovale was identified.  NUC stress 2016: IMPRESSION: 1. No reversible ischemia or infarction.  2. Normal left ventricular wall motion.  3. Left ventricular ejection fraction 78%  4. Low-risk stress test  findings*.   Assessment/Plan:  1. PAF - has CHADSVASC of 3 (HTN, age, gender) - in NSR by exam today. Remains on Xarelto. Recent labs noted.    2. HTN - BP looks great on her current regimen.   3. High risk medicine/chronic anticoagulation -  No problems noted. Would continue with her current regimen for now.   4. Prior bradycardia - avoiding AV nodal blocking agents - no symptoms noted.   5. DOE - most likely multifactorial - she feels as if this is stable. Encouraged CV risk  factor modification.   6. Obesity - discussed at length - my tips given. She loves diet Dr. Malachi Bonds. She does seem motivated to make changes.   7. Elevated glucose - being worked up by PCP - she does seem motivated to make changes with her diet/weight.    Current medicines are reviewed with the patient today.  The patient does not have concerns regarding medicines other than what has been noted above.  The following changes have been made:  See above.  Labs/ tests ordered today include:   No orders of the defined types were placed in this encounter.    Disposition:   She would like to see me in about 4 months.   Patient is agreeable to this plan and will call if any problems develop in the interim.   SignedTruitt Merle, NP  05/04/2017 3:14 PM  [place] [address] [place], [place]  [address] Phone: [phone] Fax: [phone]

## 2017-08-10 ENCOUNTER — Other Ambulatory Visit: Payer: Self-pay | Admitting: Cardiology

## 2017-08-12 ENCOUNTER — Telehealth: Payer: Self-pay | Admitting: *Deleted

## 2017-08-12 NOTE — Telephone Encounter (Signed)
Patient presented to the office stating that she is having issues with her insurance which she is working to get resolved. In the mean time, she is requesting samples which I have provided her with.

## 2017-08-28 ENCOUNTER — Encounter: Payer: Self-pay | Admitting: Nurse Practitioner

## 2017-09-07 ENCOUNTER — Encounter: Payer: Self-pay | Admitting: Nurse Practitioner

## 2017-09-07 ENCOUNTER — Ambulatory Visit (INDEPENDENT_AMBULATORY_CARE_PROVIDER_SITE_OTHER): Payer: Medicare HMO | Admitting: Nurse Practitioner

## 2017-09-07 VITALS — BP 118/72 | HR 97 | Ht 60.0 in | Wt 207.0 lb

## 2017-09-07 DIAGNOSIS — I480 Paroxysmal atrial fibrillation: Secondary | ICD-10-CM

## 2017-09-07 DIAGNOSIS — Z7901 Long term (current) use of anticoagulants: Secondary | ICD-10-CM | POA: Diagnosis not present

## 2017-09-07 DIAGNOSIS — Z79899 Other long term (current) drug therapy: Secondary | ICD-10-CM

## 2017-09-07 DIAGNOSIS — I10 Essential (primary) hypertension: Secondary | ICD-10-CM

## 2017-09-07 MED ORDER — DILTIAZEM HCL ER COATED BEADS 180 MG PO CP24: 180.0000 mg | ORAL_CAPSULE | Freq: Every day | ORAL | 3 refills | Status: DC

## 2017-09-07 NOTE — Patient Instructions (Addendum)
We will be checking the following labs today - BMET & CBC   Medication Instructions:    Continue with your current medicines. BUT  I am adding Diltiazem 180 mg to take one a day - this has been sent to your pharmacy    Testing/Procedures To Be Arranged:  Echocardiogram  Lexiscan Myoview  Follow-Up:   See Dr. Lovena Le for discussion of options for your atrial fib after your tests are complete  See Dr. Marlou Porch in 3 months    Other Special Instructions:   N/A    If you need a refill on your cardiac medications before your next appointment, please call your pharmacy.   Call the [place] office at [phone] if you have any questions, problems or concerns.

## 2017-09-07 NOTE — Addendum Note (Signed)
Addended by: Drue Novel I on: 09/07/2017 11:51 AM   Modules accepted: Orders

## 2017-09-07 NOTE — Progress Notes (Signed)
CARDIOLOGY OFFICE NOTE  Date:  09/07/2017    Cheryl Gray Date of Birth: [birth year] Medical Record #[id number]  PCP:  Rubie Maid, MD  Cardiologist:  Marisa Cyphers    Chief Complaint  Patient presents with  . Atrial Fibrillation    Follow up visit - seen for Dr. Marlou Porch    History of Present Illness: Cheryl Gray is a 72 y.o. female who presents today for a follow up visit. Seen for Dr. Marlou Porch.   She has a history of PAF, atypical chest pain (negative Myoview 2016), breast cancer, GERD, HTN, obesity and HLD. She is on Xarelto. She has had documented bradycardia with prior HR in the 40's while hospitalized back in 2016. She remains off of AV nodal blocking agents.   Last seen in November - was doing ok. Still struggling with weight loss. Was being worked up for elevated glucose - she did seem motivated to make changes.   Comes in today. Here with her son Herbie [place]. She has been started on Metformin since last visit here - she notes she does not believe that she has diabetes. Notes a few episodes of chest pain and fluttering - nothing that has lasted "very long" but she noticed it. May be a little tired and may have a "slight discomfort" in her chest today once I told her she was in AF today by EKG - initially told me she was feeling fine. Has had some headaches. May have a little extra swelling noted as well.  No real exercise. She still loves her diet Dr. Malachi Bonds. She has had no missed doses of Xarelto - she notes some insurance issues with it but she has been compliant.   Past Medical History:  Diagnosis Date  . Bronchitis   . Cancer [facility])    Breast cancer  . Chicken pox   . GERD (gastroesophageal reflux disease)   . Hyperlipidemia   . Hypertension   . Thyroid condition     Past Surgical History:  Procedure Laterality Date  . ABDOMINAL HYSTERECTOMY  1989  . APPENDECTOMY  1975  . BREAST SURGERY  Breast lift  . CHOLECYSTECTOMY  1975  . SHOULDER  SURGERY Left 2005     Medications: Current Meds  Medication Sig  . acetaminophen (TYLENOL) 500 MG tablet Take 500 mg by mouth every 6 (six) hours as needed for mild pain, moderate pain or headache.  . anastrozole (ARIMIDEX) 1 MG tablet Take 1 mg by mouth daily.  Marland Kitchen gabapentin (NEURONTIN) 100 MG capsule Take 100 mg 3 (three) times daily as needed by mouth.  Marland Kitchen ibuprofen (ADVIL,MOTRIN) 600 MG tablet Take 600 mg by mouth every 6 (six) hours as needed for fever, headache, mild pain, moderate pain or cramping.  Marland Kitchen lisinopril-hydrochlorothiazide (PRINZIDE,ZESTORETIC) 20-12.5 MG tablet Take 1 tablet by mouth daily.  . metFORMIN (GLUCOPHAGE) 500 MG tablet Take 500 mg by mouth 2 (two) times daily.  . Multiple Vitamins-Minerals (PRESERVISION AREDS 2 PO) Take 1 capsule 2 (two) times daily by mouth.  . pantoprazole (PROTONIX) 40 MG tablet Take 1 tablet (40 mg total) by mouth daily.  . simvastatin (ZOCOR) 10 MG tablet Take 1 tablet (10 mg total) by mouth at bedtime.  Alveda Reasons 20 MG TABS tablet TAKE 1 TABLET BY MOUTH ONCE DAILY WITH  SUPPER     Allergies: Allergies  Allergen Reactions  . Codeine     hallucinations.  . Latex Rash    Other  . Tape Rash and  Other (See Comments)    Blisters skin if on too long     Social History: The patient  reports that she has quit smoking. She has never used smokeless tobacco. She reports that she does not drink alcohol or use drugs.   Family History: The patient's family history includes Cancer in her father; Heart disease in her brother; Hyperlipidemia in her father and mother; Hypertension in her father and mother.   Review of Systems: Please see the history of present illness.   Otherwise, the review of systems is positive for none.   All other systems are reviewed and negative.   Physical Exam: VS:  BP 118/72 (BP Location: Left Arm, Patient Position: Sitting)   Pulse 97   Ht 5' (1.524 m)   Wt 207 lb (93.9 kg)   LMP 06/17/1987   SpO2 96%   BMI  40.43 kg/m  .  BMI Body mass index is 40.43 kg/m.  Wt Readings from Last 3 Encounters:  09/07/17 207 lb (93.9 kg)  05/04/17 209 lb (94.8 kg)  08/05/16 206 lb (93.4 kg)    General: Pleasant. Alert and in no acute distress.   HEENT: Normal. Poor dentition. 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   Neck: Supple, no JVD, carotid bruits, or masses noted.  Cardiac: Irregular irregular rhythm. Her rate is fast today. About 100 by my count. Trace edema.  Respiratory:  Lungs are clear to auscultation bilaterally with normal work of breathing.  GI: Soft and nontender.  MS: No deformity or atrophy. Gait and ROM intact.  Skin: Warm and dry. Color is normal.  Neuro:  Strength and sensation are intact and no gross focal deficits noted.  Psych: Alert, appropriate and with normal affect.   LABORATORY DATA:  EKG:  EKG is ordered today. This shows atrial fib with VR of 97 bpm. She was in NSR by EKG 07/2016 with HR of 73 noted.   Lab Results  Component Value Date   WBC 7.2 08/05/2016   HGB 12.2 08/05/2016   HCT 37.0 08/05/2016   PLT 342 08/05/2016   GLUCOSE 206 (H) 08/05/2016   CHOL 157 06/03/2015   TRIG 113 06/03/2015   HDL 45 06/03/2015   LDLDIRECT 143.2 01/24/2013   LDLCALC 89 06/03/2015   ALT 18 06/04/2015   AST 16 06/04/2015   NA 139 08/05/2016   K 4.1 08/05/2016   CL 96 08/05/2016   CREATININE 0.85 08/05/2016   BUN 25 08/05/2016   CO2 25 08/05/2016   TSH 0.768 06/04/2015   HGBA1C 7.2 (H) 06/03/2015     BNP (last 3 results) No results for input(s): BNP in the last 8760 hours.  ProBNP (last 3 results) No results for input(s): PROBNP in the last 8760 hours.   Other Studies Reviewed Today:  ECHO 2016: Study Conclusions  - Left ventricle: The cavity size was normal. Systolic function was normal. The estimated ejection fraction was in the range of 55% to 60%. Wall motion was normal; there were no regional wall motion abnormalities. - Left atrium: The atrium was mildly  dilated. - Atrial septum: No defect or patent foramen ovale was identified.  NUC stress 2016: IMPRESSION: 1. No reversible ischemia or infarction.  2. Normal left ventricular wall motion.  3. Left ventricular ejection fraction 78%  4. Low-risk stress test findings*.   Assessment/Plan:  1. PAF - has CHADSVASC of 4 (HTN, DM, age, gender) - She is in AF today - her rate is not controlled. Probably symptomatic -  seemed to note more symptoms after I told her she was in AF today. Will get her echo and Myoview updated. Refer to EP for discussion of other options (AAD therapy/rate control, etc) - she declined visit to the AF clinic due to co-pay. Adding low dose Diltiazem today. Lab today. Further disposition to follow.   2. HTN - BP looks great on her current regimen.   3. High risk medicine/chronic anticoagulation -  getting surveillance labs today.   4. Prior bradycardia - this was when she was in NSR - rate is not controlled now - will try adding low dose CCB therapy - referring to EP - may at some point need PPM. She has not been lightheaded or dizzy. No passing out spells noted.   5. Multiple CV risk factors - some atypical chest pain - may be due to inadequate rate control - will get Myoview updated.   6. Obesity - she continues to struggle with her weight. This certainly affects her AF and we talked about this today.   Current medicines are reviewed with the patient today.  The patient does not have concerns regarding medicines other than what has been noted above.  The following changes have been made:  See above.  Labs/ tests ordered today include:    Orders Placed This Encounter  Procedures  . Basic metabolic panel  . Ambulatory referral to Cardiac Electrophysiology  . MYOCARDIAL PERFUSION IMAGING  . EKG 12-Lead  . ECHOCARDIOGRAM COMPLETE     Disposition:   FU with Dr. Marlou Porch in 3 months otherwise with plan as noted above.   Patient is agreeable to this  plan and will call if any problems develop in the interim.   SignedTruitt Merle, NP  09/07/2017 11:19 AM  [facility] [address] [place],   [address] Phone: [phone] Fax: [phone]

## 2017-09-08 LAB — BASIC METABOLIC PANEL
BUN/Creatinine Ratio: 24 (ref 12–28)
BUN: 19 mg/dL (ref 8–27)
CO2: 23 mmol/L (ref 20–29)
Calcium: 9.2 mg/dL (ref 8.7–10.3)
Chloride: 99 mmol/L (ref 96–106)
Creatinine, Ser: 0.8 mg/dL (ref 0.57–1.00)
GFR calc Af Amer: 87 mL/min/{1.73_m2} (ref >59)
GFR calc non Af Amer: 75 mL/min/{1.73_m2} (ref >59)
Glucose: 182 mg/dL — ABNORMAL HIGH (ref 65–99)
Potassium: 4.3 mmol/L (ref 3.5–5.2)
Sodium: 136 mmol/L (ref 134–144)

## 2017-09-08 LAB — CBC
Hematocrit: 39 % (ref 34.0–46.6)
Hemoglobin: 13 g/dL (ref 11.1–15.9)
MCH: 29.3 pg (ref 26.6–33.0)
MCHC: 33.3 g/dL (ref 31.5–35.7)
MCV: 88 fL (ref 79–97)
Platelets: 347 10*3/uL (ref 150–379)
RBC: 4.44 x10E6/uL (ref 3.77–5.28)
RDW: 14.2 % (ref 12.3–15.4)
WBC: 5.6 10*3/uL (ref 3.4–10.8)

## 2017-09-16 ENCOUNTER — Encounter (HOSPITAL_COMMUNITY): Payer: Medicare HMO

## 2017-09-16 ENCOUNTER — Other Ambulatory Visit (HOSPITAL_COMMUNITY): Payer: Medicare HMO

## 2017-09-22 ENCOUNTER — Institutional Professional Consult (permissible substitution): Payer: Medicare HMO | Admitting: Internal Medicine

## 2017-10-12 ENCOUNTER — Telehealth (HOSPITAL_COMMUNITY): Payer: Self-pay | Admitting: *Deleted

## 2017-10-12 NOTE — Telephone Encounter (Signed)
Left message on voicemail per DPR in reference to upcoming appointment scheduled on 10/13/17 at 0945 with detailed instructions given per Myocardial Perfusion Study Information Sheet for the test. LM to arrive 15 minutes early, and that it is imperative to arrive on time for appointment to keep from having the test rescheduled. If you need to cancel or reschedule your appointment, please call the office within 24 hours of your appointment. Failure to do so may result in a cancellation of your appointment, and a $50 no show fee. Phone number given for call back for any questions.

## 2017-10-13 ENCOUNTER — Other Ambulatory Visit: Payer: Self-pay

## 2017-10-13 ENCOUNTER — Ambulatory Visit (HOSPITAL_BASED_OUTPATIENT_CLINIC_OR_DEPARTMENT_OTHER): Payer: Medicare HMO

## 2017-10-13 ENCOUNTER — Ambulatory Visit (HOSPITAL_COMMUNITY): Payer: Medicare HMO | Attending: Cardiovascular Disease

## 2017-10-13 VITALS — Ht 60.0 in | Wt 207.0 lb

## 2017-10-13 DIAGNOSIS — I480 Paroxysmal atrial fibrillation: Secondary | ICD-10-CM

## 2017-10-13 DIAGNOSIS — I119 Hypertensive heart disease without heart failure: Secondary | ICD-10-CM | POA: Insufficient documentation

## 2017-10-13 DIAGNOSIS — R55 Syncope and collapse: Secondary | ICD-10-CM | POA: Diagnosis not present

## 2017-10-13 DIAGNOSIS — I10 Essential (primary) hypertension: Secondary | ICD-10-CM

## 2017-10-13 DIAGNOSIS — I2510 Atherosclerotic heart disease of native coronary artery without angina pectoris: Secondary | ICD-10-CM | POA: Insufficient documentation

## 2017-10-13 DIAGNOSIS — R079 Chest pain, unspecified: Secondary | ICD-10-CM | POA: Diagnosis present

## 2017-10-13 DIAGNOSIS — E119 Type 2 diabetes mellitus without complications: Secondary | ICD-10-CM | POA: Diagnosis not present

## 2017-10-13 DIAGNOSIS — Z6841 Body Mass Index (BMI) 40.0 and over, adult: Secondary | ICD-10-CM | POA: Diagnosis not present

## 2017-10-13 DIAGNOSIS — E669 Obesity, unspecified: Secondary | ICD-10-CM | POA: Diagnosis not present

## 2017-10-13 DIAGNOSIS — R002 Palpitations: Secondary | ICD-10-CM | POA: Diagnosis not present

## 2017-10-13 DIAGNOSIS — I081 Rheumatic disorders of both mitral and tricuspid valves: Secondary | ICD-10-CM | POA: Diagnosis not present

## 2017-10-13 DIAGNOSIS — Z7901 Long term (current) use of anticoagulants: Secondary | ICD-10-CM

## 2017-10-13 DIAGNOSIS — Z79899 Other long term (current) drug therapy: Secondary | ICD-10-CM

## 2017-10-13 DIAGNOSIS — E785 Hyperlipidemia, unspecified: Secondary | ICD-10-CM | POA: Insufficient documentation

## 2017-10-13 DIAGNOSIS — R110 Nausea: Secondary | ICD-10-CM

## 2017-10-13 LAB — MYOCARDIAL PERFUSION IMAGING
LV dias vol: 60 mL (ref 46–106)
LV sys vol: 17 mL
Peak HR: 137 {beats}/min
RATE: 0.32
Rest HR: 93 {beats}/min
SDS: 5
SRS: 2
SSS: 7
TID: 0.92

## 2017-10-13 LAB — ECHOCARDIOGRAM COMPLETE
Height: 60 in
Weight: 3312 oz

## 2017-10-13 MED ORDER — TECHNETIUM TC 99M TETROFOSMIN IV KIT
10.2000 | PACK | Freq: Once | INTRAVENOUS | Status: AC | PRN
  Administered 2017-10-13: 10.2 via INTRAVENOUS
  Filled 2017-10-13: qty 11

## 2017-10-13 MED ORDER — AMINOPHYLLINE 25 MG/ML IV SOLN
75.0000 mg | Freq: Once | INTRAVENOUS | Status: AC
  Administered 2017-10-13: 75 mg via INTRAVENOUS

## 2017-10-13 MED ORDER — REGADENOSON 0.4 MG/5ML IV SOLN
0.4000 mg | Freq: Once | INTRAVENOUS | Status: AC
  Administered 2017-10-13: 0.4 mg via INTRAVENOUS

## 2017-10-13 MED ORDER — TECHNETIUM TC 99M TETROFOSMIN IV KIT
31.2000 | PACK | Freq: Once | INTRAVENOUS | Status: AC | PRN
  Administered 2017-10-13: 31.2 via INTRAVENOUS
  Filled 2017-10-13: qty 32

## 2017-11-02 ENCOUNTER — Ambulatory Visit (INDEPENDENT_AMBULATORY_CARE_PROVIDER_SITE_OTHER): Payer: Medicare HMO | Admitting: Internal Medicine

## 2017-11-02 ENCOUNTER — Encounter: Payer: Self-pay | Admitting: Internal Medicine

## 2017-11-02 ENCOUNTER — Encounter (INDEPENDENT_AMBULATORY_CARE_PROVIDER_SITE_OTHER): Payer: Self-pay

## 2017-11-02 VITALS — BP 132/72 | HR 94 | Ht 60.0 in | Wt 206.2 lb

## 2017-11-02 DIAGNOSIS — I480 Paroxysmal atrial fibrillation: Secondary | ICD-10-CM

## 2017-11-02 DIAGNOSIS — I10 Essential (primary) hypertension: Secondary | ICD-10-CM

## 2017-11-02 MED ORDER — DILTIAZEM HCL ER COATED BEADS 240 MG PO CP24: 240.0000 mg | ORAL_CAPSULE | Freq: Every day | ORAL | 3 refills | Status: DC

## 2017-11-02 NOTE — Patient Instructions (Addendum)
Medication Instructions:  Your physician has recommended you make the following change in your medication:  1.  When you complete your current prescription of Cardizem 180 you are going to change doses. 2.  Your new dose of Cardizem will be 240 mg one capsule by mouth daily.  Labwork: None ordered.  Testing/Procedures: None ordered.  Follow-Up: Your physician wants you to follow-up in: 3 months with Dr. Lovena Le.     Any Other Special Instructions Will Be Listed Below (If Applicable).  If you need a refill on your cardiac medications before your next appointment, please call your pharmacy.

## 2017-11-02 NOTE — Progress Notes (Signed)
HPI Cheryl Gray is referred today by Dr. Marlou Porch for evaluation of atrial fib. She is a pleasant obese 72 yo woman who has had symptomatic atrial fib for over a year. She has been placed on systemic anti-coagulation with CHADS VAsc of over 3. She initially was quite symptomatic but since being placed on cardizem, her palpitations have improved. She is still a little sob and has occaisional palpitations. She also notes some peripheral edema. No anginal symptoms. No syncope. Allergies  Allergen Reactions  . Codeine     hallucinations.  . Latex Rash    Other  . Tape Rash and Other (See Comments)    Blisters skin if on too long      Current Outpatient Medications  Medication Sig Dispense Refill  . acetaminophen (TYLENOL) 500 MG tablet Take 500 mg by mouth every 6 (six) hours as needed for mild pain, moderate pain or headache.    . anastrozole (ARIMIDEX) 1 MG tablet Take 1 mg by mouth daily.    Marland Kitchen diltiazem (CARDIZEM CD) 180 MG 24 hr capsule Take 1 capsule (180 mg total) by mouth daily. 90 capsule 3  . gabapentin (NEURONTIN) 100 MG capsule Take 100 mg 3 (three) times daily as needed by mouth.    Marland Kitchen ibuprofen (ADVIL,MOTRIN) 600 MG tablet Take 600 mg by mouth every 6 (six) hours as needed for fever, headache, mild pain, moderate pain or cramping.    Marland Kitchen lisinopril-hydrochlorothiazide (PRINZIDE,ZESTORETIC) 20-12.5 MG tablet Take 1 tablet by mouth daily. 90 tablet 3  . metFORMIN (GLUMETZA) 500 MG (MOD) 24 hr tablet Take 1,000 mg by mouth daily with breakfast.    . Multiple Vitamins-Minerals (PRESERVISION AREDS 2 PO) Take 1 capsule 2 (two) times daily by mouth.    . pantoprazole (PROTONIX) 40 MG tablet Take 1 tablet (40 mg total) by mouth daily. 30 tablet 3  . XARELTO 20 MG TABS tablet TAKE 1 TABLET BY MOUTH ONCE DAILY WITH  SUPPER 90 tablet 1   No current facility-administered medications for this visit.      Past Medical History:  Diagnosis Date  . Bronchitis   . Cancer [facility])    Breast cancer  . Chicken pox   . GERD (gastroesophageal reflux disease)   . Hyperlipidemia   . Hypertension   . Thyroid condition     ROS:   All systems reviewed and negative except as noted in the HPI.   Past Surgical History:  Procedure Laterality Date  . ABDOMINAL HYSTERECTOMY  1989  . APPENDECTOMY  1975  . BREAST SURGERY  Breast lift  . CHOLECYSTECTOMY  1975  . SHOULDER SURGERY Left 2005     Family History  Problem Relation Age of Onset  . Cancer Father        lung  . Hyperlipidemia Father   . Hypertension Father   . Hyperlipidemia Mother   . Hypertension Mother   . Heart disease Brother        Stents     Social History   Socioeconomic History  . Marital status: Single    Spouse name: Not on file  . Number of children: 3   . Years of education: 9  . Highest education level: Not on file  Occupational History  . Occupation: Retired  Scientific laboratory technician  . Financial resource strain: Not on file  . Food insecurity:    Worry: Not on file    Inability: Not on file  . Transportation needs:    Medical:  Not on file    Non-medical: Not on file  Tobacco Use  . Smoking status: Former Research scientist (life sciences)  . Smokeless tobacco: Never Used  Substance and Sexual Activity  . Alcohol use: No  . Drug use: No  . Sexual activity: Not Currently  Lifestyle  . Physical activity:    Days per week: Not on file    Minutes per session: Not on file  . Stress: Not on file  Relationships  . Social connections:    Talks on phone: Not on file    Gets together: Not on file    Attends religious service: Not on file    Active member of club or organization: Not on file    Attends meetings of clubs or organizations: Not on file    Relationship status: Not on file  . Intimate partner violence:    Fear of current or ex partner: Not on file    Emotionally abused: Not on file    Physically abused: Not on file    Forced sexual activity: Not on file  Other Topics Concern  . Not on file  Social  History Narrative  . Not on file     BP 132/72   Pulse 94   Ht 5' (1.524 m)   Wt 206 lb 3.2 oz (93.5 kg)   LMP 06/17/1987   BMI 40.27 kg/m   Physical Exam:  Well appearing 72 yo woman, NAD HEENT: Unremarkable Neck:  No JVD, no thyromegally Lymphatics:  No adenopathy Back:  No CVA tenderness Lungs:  Clear with no wheezes HEART:  IRegular rate rhythm, no murmurs, no rubs, no clicks Abd:  soft, positive bowel sounds, no organomegally, no rebound, no guarding Ext:  2 plus pulses, no edema, no cyanosis, no clubbing Skin:  No rashes no nodules Neuro:  CN II through XII intact, motor grossly intact  EKG - atrial fib with a controlled VR  DEVICE  Normal device function.  See PaceArt for details.   Assess/Plan: 1. Persistent atrial fib - I discussed the issues as they pertain to her atrial fib. She appears to be doing well with rate control. I have asked that she uptitrate her dose of cardizem to 240 mg daily when she runs out of her current prescription. 2. HTN - her blood pressure remains elevated and I have asked her to reduce the salt in her diet and hopefully increasing her cardizem will help get her pressure under control. 3. Sinus node dysfunction - because she is likely to be in atrial fib for the foreseeable future, I would suggest we uptitrate her calcium channel blocker 4. Diastolic heart failure - she has peripheral edema which could be due to the cardizem. Once her rates are controlled, if her dyspnea persists, then we would consider lasix.   Mikle Bosworth.D.

## 2017-11-23 ENCOUNTER — Ambulatory Visit: Payer: Medicare HMO | Admitting: Cardiology

## 2018-02-05 ENCOUNTER — Ambulatory Visit: Payer: Medicare HMO | Admitting: Internal Medicine

## 2018-03-01 ENCOUNTER — Other Ambulatory Visit: Payer: Self-pay | Admitting: Cardiology

## 2018-03-01 NOTE — Telephone Encounter (Signed)
Pt last saw Dr Lovena Le 11/02/17, last labs 09/07/17 Creat 0.80, age 72, weight 93.5kg, CrCl 96.58, based on CrCl pt is on appropriate dosage of Xarelto 20mg  QD.  Will refill rx.

## 2018-03-05 ENCOUNTER — Telehealth: Payer: Self-pay | Admitting: *Deleted

## 2018-03-05 NOTE — Telephone Encounter (Signed)
Patient presented to the office requesting xarelto samples.She says that she isn't sure why the office sent the rx in for a ninety day supply as she didn't request this amount and the copay for that is over $200. The pharmacy informed her that they can break that down into one month supplies at a time which is around $80 and that is what she normally pays. She states that she has additional bills this month and wasn't able to have the medication refilled.  I provided her with two weeks supply.

## 2018-06-01 ENCOUNTER — Encounter: Payer: Self-pay | Admitting: Internal Medicine

## 2018-06-01 ENCOUNTER — Ambulatory Visit: Payer: Medicare HMO | Admitting: Internal Medicine

## 2018-06-01 VITALS — BP 102/56 | HR 91 | Ht 60.0 in | Wt 210.2 lb

## 2018-06-01 DIAGNOSIS — I5032 Chronic diastolic (congestive) heart failure: Secondary | ICD-10-CM | POA: Diagnosis not present

## 2018-06-01 DIAGNOSIS — I480 Paroxysmal atrial fibrillation: Secondary | ICD-10-CM

## 2018-06-01 DIAGNOSIS — I10 Essential (primary) hypertension: Secondary | ICD-10-CM

## 2018-06-01 NOTE — Progress Notes (Signed)
HPI Cheryl Gray returns today for ongoing evaluation of atrial fib. She is a pleasant obese 72 yo woman who has had symptomatic atrial fib for over a year. She initially was quite symptomatic but since being placed on cardizem, her palpitations have improved. She was hospitalized at [facility] with bronchitis/pneumonia and found to have colon CA. She is pending colonoscopy in January. Her dose of cardizem was increased to 300 mg daily and her Prinizide was stopped due to low blood pressure.  Allergies  Allergen Reactions  . Codeine     hallucinations.  . Latex Rash    Other  . Tape Rash and Other (See Comments)    Blisters skin if on too long      Current Outpatient Medications  Medication Sig Dispense Refill  . acetaminophen (TYLENOL) 500 MG tablet Take 500 mg by mouth every 6 (six) hours as needed for mild pain, moderate pain or headache.    . anastrozole (ARIMIDEX) 1 MG tablet Take 1 mg by mouth daily.    Marland Kitchen diltiazem (CARDIZEM CD) 240 MG 24 hr capsule Take 1 capsule (240 mg total) by mouth daily. 90 capsule 3  . gabapentin (NEURONTIN) 100 MG capsule Take 100 mg 3 (three) times daily as needed by mouth.    Marland Kitchen ibuprofen (ADVIL,MOTRIN) 600 MG tablet Take 600 mg by mouth every 6 (six) hours as needed for fever, headache, mild pain, moderate pain or cramping.    Marland Kitchen lisinopril-hydrochlorothiazide (PRINZIDE,ZESTORETIC) 20-12.5 MG tablet Take 1 tablet by mouth daily. 90 tablet 3  . metFORMIN (GLUMETZA) 500 MG (MOD) 24 hr tablet Take 1,000 mg by mouth daily with breakfast.    . Multiple Vitamins-Minerals (PRESERVISION AREDS 2 PO) Take 1 capsule 2 (two) times daily by mouth.    . pantoprazole (PROTONIX) 40 MG tablet Take 1 tablet (40 mg total) by mouth daily. 30 tablet 3  . XARELTO 20 MG TABS tablet TAKE 1 TABLET BY MOUTH ONCE DAILY WITH  SUPPER 90 tablet 1   No current facility-administered medications for this visit.      Past Medical History:  Diagnosis Date  . Bronchitis   .  Cancer [facility])    Breast cancer  . Chicken pox   . GERD (gastroesophageal reflux disease)   . Hyperlipidemia   . Hypertension   . Thyroid condition     ROS:   All systems reviewed and negative except as noted in the HPI.   Past Surgical History:  Procedure Laterality Date  . ABDOMINAL HYSTERECTOMY  1989  . APPENDECTOMY  1975  . BREAST SURGERY  Breast lift  . CHOLECYSTECTOMY  1975  . SHOULDER SURGERY Left 2005     Family History  Problem Relation Age of Onset  . Cancer Father        lung  . Hyperlipidemia Father   . Hypertension Father   . Hyperlipidemia Mother   . Hypertension Mother   . Heart disease Brother        Stents     Social History   Socioeconomic History  . Marital status: Single    Spouse name: Not on file  . Number of children: 3   . Years of education: 9  . Highest education level: Not on file  Occupational History  . Occupation: Retired  Scientific laboratory technician  . Financial resource strain: Not on file  . Food insecurity:    Worry: Not on file    Inability: Not on file  . Transportation  needs:    Medical: Not on file    Non-medical: Not on file  Tobacco Use  . Smoking status: Former Research scientist (life sciences)  . Smokeless tobacco: Never Used  Substance and Sexual Activity  . Alcohol use: No  . Drug use: No  . Sexual activity: Not Currently  Lifestyle  . Physical activity:    Days per week: Not on file    Minutes per session: Not on file  . Stress: Not on file  Relationships  . Social connections:    Talks on phone: Not on file    Gets together: Not on file    Attends religious service: Not on file    Active member of club or organization: Not on file    Attends meetings of clubs or organizations: Not on file    Relationship status: Not on file  . Intimate partner violence:    Fear of current or ex partner: Not on file    Emotionally abused: Not on file    Physically abused: Not on file    Forced sexual activity: Not on file  Other Topics Concern  . Not  on file  Social History Narrative  . Not on file     LMP 06/17/1987   Physical Exam:  obese appearing 72 yo woman, NAD HEENT: Unremarkable Neck:  6 cm JVD, no thyromegally Lymphatics:  No adenopathy Back:  No CVA tenderness Lungs:  Clear with no wheezes HEART:  Regular rate rhythm, no murmurs, no rubs, no clicks Abd:  soft, positive bowel sounds, no organomegally, no rebound, no guarding Ext:  2 plus pulses, 1+ edema, no cyanosis, no clubbing Skin:  No rashes no nodules Neuro:  CN II through XII intact, motor grossly intact  EKG -  Atrial fib with a controlled VR  Assess/Plan: 1. Chronic atrial fib - her rates are reasonably well controlled. She will continue cardizem. 2. Diastolic heart failure - she is pending starting lasix. She is encouraged to maintain a low sodium diet. 3. Obesity - she is encouraged to lose weight. 4. Preoperative eval - she is pending colon CA resection. She is an acceptable surgical risk. She does not need bridging.  Mikle Bosworth.D.

## 2018-06-01 NOTE — Patient Instructions (Addendum)
Medication Instructions:  Your physician recommends that you continue on your current medications as directed. Please refer to the Current Medication list given to you today.  Labwork: None ordered.  Testing/Procedures: None ordered.  Follow-Up: Your physician wants you to follow-up in: March or April of 2020 with Dr. Lovena Le.    Any Other Special Instructions Will Be Listed Below (If Applicable).  If you need a refill on your cardiac medications before your next appointment, please call your pharmacy.

## 2018-06-01 NOTE — Addendum Note (Signed)
Addended by: Rose Phi on: 06/01/2018 05:06 PM   Modules accepted: Orders

## 2018-06-17 ENCOUNTER — Other Ambulatory Visit: Payer: Self-pay

## 2018-06-17 MED ORDER — DILTIAZEM HCL ER COATED BEADS 300 MG PO CP24: 300.0000 mg | ORAL_CAPSULE | Freq: Every day | ORAL | 3 refills | Status: DC

## 2018-08-31 ENCOUNTER — Telehealth: Payer: Self-pay

## 2018-08-31 NOTE — Telephone Encounter (Signed)
Call placed to Pt.  Advised at this time-routine follow up appointments are being cancelled to reduce possible exposure for our patients.  Per Pt-feels ok.  Has had recent bowel surgery for cancer.  Advised appointment would be cancelled and Pt would be contacted to reschedule.  Advised to call office if any needs.

## 2018-09-02 ENCOUNTER — Ambulatory Visit: Payer: Medicare HMO | Admitting: Internal Medicine

## 2019-05-11 ENCOUNTER — Ambulatory Visit: Payer: Medicare HMO | Admitting: Internal Medicine

## 2019-06-21 ENCOUNTER — Other Ambulatory Visit: Payer: Self-pay | Admitting: Internal Medicine

## 2019-06-22 MED ORDER — DILTIAZEM HCL ER COATED BEADS 300 MG PO CP24: 300.0000 mg | ORAL_CAPSULE | Freq: Every day | ORAL | 0 refills | Status: DC

## 2019-07-14 ENCOUNTER — Ambulatory Visit: Payer: Medicare HMO | Admitting: Internal Medicine

## 2019-08-31 ENCOUNTER — Ambulatory Visit: Payer: Medicare HMO | Admitting: Internal Medicine

## 2019-08-31 ENCOUNTER — Other Ambulatory Visit: Payer: Self-pay

## 2019-08-31 VITALS — BP 108/58 | Ht 60.0 in | Wt 217.0 lb

## 2019-08-31 DIAGNOSIS — I480 Paroxysmal atrial fibrillation: Secondary | ICD-10-CM | POA: Diagnosis not present

## 2019-08-31 DIAGNOSIS — I5032 Chronic diastolic (congestive) heart failure: Secondary | ICD-10-CM | POA: Diagnosis not present

## 2019-08-31 MED ORDER — FUROSEMIDE 40 MG PO TABS: 40.0000 mg | ORAL_TABLET | Freq: Every day | ORAL | 3 refills | Status: DC

## 2019-08-31 NOTE — Patient Instructions (Addendum)
Medication Instructions:  Your physician has recommended you make the following change in your medication:   1.  Start taking furosemide 40 mg- Take one tablet by mouth every day.  Labwork: None ordered.  Testing/Procedures: None ordered.  Follow-Up: Your physician wants you to follow-up in: 6 months with Dr. Lovena Le.   You will receive a reminder letter in the mail two months in advance. If you don't receive a letter, please call our office to schedule the follow-up appointment.  Any Other Special Instructions Will Be Listed Below (If Applicable).  If you need a refill on your cardiac medications before your next appointment, please call your pharmacy.

## 2019-08-31 NOTE — Progress Notes (Signed)
HPI Cheryl Gray returns today for ongoing evaluation of atrial fib. She is a pleasant obese 74 yo woman who has had symptomatic atrial fib for over a year. She initially was quite symptomatic but since being placed on cardizem, her palpitations have improved. She was hospitalized at [facility] with bronchitis/pneumonia and found to have colon CA. She underwent colectomy and did not require chemo or XRT. She has had dyspnea with exertion. She notes peripheral edema and fatigue. She has not had any bleeding or chest pain. She notes that she sleeps better in a chair. Allergies  Allergen Reactions  . Codeine     hallucinations.  . Other Other (See Comments)    Blisters skin if on too long ;  [place]   . Latex Rash    Other  . Tape Rash and Other (See Comments)    Blisters skin if on too long      Current Outpatient Medications  Medication Sig Dispense Refill  . acetaminophen (TYLENOL) 500 MG tablet Take 500 mg by mouth every 6 (six) hours as needed for mild pain, moderate pain or headache.    . anastrozole (ARIMIDEX) 1 MG tablet Take 1 mg by mouth daily.    Marland Kitchen apixaban (ELIQUIS) 5 MG TABS tablet Take 5 mg by mouth 2 (two) times daily.    Marland Kitchen diltiazem (CARDIZEM CD) 300 MG 24 hr capsule Take 1 capsule (300 mg total) by mouth daily. Please keep upcoming appt in January with Dr. Lovena Le before anymore refills. Thank you 90 capsule 0  . gabapentin (NEURONTIN) 100 MG capsule Take 100 mg 3 (three) times daily as needed by mouth.    Marland Kitchen ibuprofen (ADVIL,MOTRIN) 600 MG tablet Take 600 mg by mouth every 6 (six) hours as needed for fever, headache, mild pain, moderate pain or cramping.    Marland Kitchen lisinopril (ZESTRIL) 5 MG tablet Take 5 mg by mouth daily.    . metFORMIN (GLUMETZA) 500 MG (MOD) 24 hr tablet Take 1,000 mg by mouth daily with breakfast.    . Multiple Vitamins-Minerals (PRESERVISION AREDS 2 PO) Take 1 capsule 2 (two) times daily by mouth.    . pantoprazole (PROTONIX)  40 MG tablet Take 1 tablet (40 mg total) by mouth daily. 30 tablet 3  . potassium chloride (KLOR-CON) 10 MEQ tablet Take 10 mEq by mouth as needed.    . vitamin B-12 (CYANOCOBALAMIN) 1000 MCG tablet Take 1,000 mcg by mouth daily.    Marland Kitchen atorvastatin (LIPITOR) 10 MG tablet Take 10 mg by mouth daily.    . furosemide (LASIX) 40 MG tablet Take 40 mg by mouth as needed.     No current facility-administered medications for this visit.     Past Medical History:  Diagnosis Date  . Bronchitis   . Cancer [facility])    Breast cancer  . Chicken pox   . GERD (gastroesophageal reflux disease)   . Hyperlipidemia   . Hypertension   . Thyroid condition     ROS:   All systems reviewed and negative except as noted in the HPI.   Past Surgical History:  Procedure Laterality Date  . ABDOMINAL HYSTERECTOMY  1989  . APPENDECTOMY  1975  . BREAST SURGERY  Breast lift  . CHOLECYSTECTOMY  1975  . SHOULDER SURGERY Left 2005     Family History  Problem Relation Age of Onset  . Cancer Father        lung  . Hyperlipidemia Father   .  Hypertension Father   . Hyperlipidemia Mother   . Hypertension Mother   . Heart disease Brother        Stents     Social History   Socioeconomic History  . Marital status: Single    Spouse name: Not on file  . Number of children: 3   . Years of education: 9  . Highest education level: Not on file  Occupational History  . Occupation: Retired  Tobacco Use  . Smoking status: Former Research scientist (life sciences)  . Smokeless tobacco: Never Used  Substance and Sexual Activity  . Alcohol use: No  . Drug use: No  . Sexual activity: Not Currently  Other Topics Concern  . Not on file  Social History Narrative  . Not on file   Social Determinants of Health   Financial Resource Strain:   . Difficulty of Paying Living Expenses:   Food Insecurity:   . Worried About Charity fundraiser in the Last Year:   . Arboriculturist in the Last Year:   Transportation Needs:   . Lexicographer (Medical):   Marland Kitchen Lack of Transportation (Non-Medical):   Physical Activity:   . Days of Exercise per Week:   . Minutes of Exercise per Session:   Stress:   . Feeling of Stress :   Social Connections:   . Frequency of Communication with Friends and Family:   . Frequency of Social Gatherings with Friends and Family:   . Attends Religious Services:   . Active Member of Clubs or Organizations:   . Attends Archivist Meetings:   Marland Kitchen Marital Status:   Intimate Partner Violence:   . Fear of Current or Ex-Partner:   . Emotionally Abused:   Marland Kitchen Physically Abused:   . Sexually Abused:      Ht 5' (1.524 m)   Wt 217 lb (98.4 kg)   LMP 06/17/1987   BMI 42.38 kg/m   Physical Exam:  Well appearing NAD HEENT: Unremarkable Neck:  No JVD, no thyromegally Lymphatics:  No adenopathy Back:  No CVA tenderness Lungs:  Clear with no wheezes HEART:  Regular rate rhythm, no murmurs, no rubs, no clicks Abd:  soft, positive bowel sounds, no organomegally, no rebound, no guarding Ext:  2 plus pulses, no edema, no cyanosis, no clubbing Skin:  No rashes no nodules Neuro:  CN II through XII intact, motor grossly intact  EKG - atrial fib with a controlled VR   Assess/Plan: 1. Atrial fib - her VR is controlled. She is tolerating Eliquis.  2. Covid - 19 - she has not gotten infected and was initially against being vaccinated. I strongly encouraged her to become vaccinated.  3. Obesity - I asked her to try and get down to 200 or less. 4. Colon CA - she has not had any symptoms since her surgical incision.   Mikle Bosworth.D.

## 2019-09-05 DIAGNOSIS — E119 Type 2 diabetes mellitus without complications: Secondary | ICD-10-CM | POA: Insufficient documentation

## 2019-09-20 ENCOUNTER — Other Ambulatory Visit: Payer: Self-pay | Admitting: Internal Medicine

## 2019-09-20 MED ORDER — DILTIAZEM HCL ER COATED BEADS 300 MG PO CP24: 300.0000 mg | ORAL_CAPSULE | Freq: Every day | ORAL | 3 refills | Status: DC

## 2020-03-01 ENCOUNTER — Other Ambulatory Visit: Payer: Self-pay

## 2020-03-01 ENCOUNTER — Encounter: Payer: Self-pay | Admitting: Internal Medicine

## 2020-03-01 ENCOUNTER — Ambulatory Visit: Payer: Medicare HMO | Admitting: Internal Medicine

## 2020-03-01 VITALS — BP 140/78 | HR 81 | Ht 60.0 in | Wt 218.0 lb

## 2020-03-01 DIAGNOSIS — I480 Paroxysmal atrial fibrillation: Secondary | ICD-10-CM

## 2020-03-01 DIAGNOSIS — I5032 Chronic diastolic (congestive) heart failure: Secondary | ICD-10-CM | POA: Diagnosis not present

## 2020-03-01 NOTE — Progress Notes (Signed)
HPI Cheryl Gray returns today for followup. She is a pleasant 74 yo woman with persistent atrial fib, obesity and colon CA. She has been bothered by peripheral edema. She has not lost weight. She admits to dietary indiscretion.  Allergies  Allergen Reactions  . Codeine     hallucinations.  . Other Other (See Comments)    Blisters skin if on too long ;  [place]   . Latex Rash    Other  . Tape Rash and Other (See Comments)    Blisters skin if on too long      Current Outpatient Medications  Medication Sig Dispense Refill  . acetaminophen (TYLENOL) 500 MG tablet Take 500 mg by mouth every 6 (six) hours as needed for mild pain, moderate pain or headache.    . albuterol (VENTOLIN HFA) 108 (90 Base) MCG/ACT inhaler Inhale 1 puff into the lungs as needed for shortness of breath.    . anastrozole (ARIMIDEX) 1 MG tablet Take 1 mg by mouth daily.    Marland Kitchen apixaban (ELIQUIS) 5 MG TABS tablet Take 5 mg by mouth 2 (two) times daily.    Marland Kitchen atorvastatin (LIPITOR) 10 MG tablet Take 10 mg by mouth daily.    Marland Kitchen diltiazem (CARDIZEM CD) 300 MG 24 hr capsule Take 1 capsule (300 mg total) by mouth daily. 90 capsule 3  . lisinopril (ZESTRIL) 5 MG tablet Take 5 mg by mouth daily.    . metFORMIN (GLUMETZA) 500 MG (MOD) 24 hr tablet Take 1,000 mg by mouth daily with breakfast.    . pantoprazole (PROTONIX) 40 MG tablet Take 1 tablet (40 mg total) by mouth daily. 30 tablet 3  . potassium chloride (KLOR-CON) 10 MEQ tablet Take 10 mEq by mouth as needed.    . vitamin B-12 (CYANOCOBALAMIN) 1000 MCG tablet Take 1,000 mcg by mouth daily.    . furosemide (LASIX) 40 MG tablet Take 1 tablet (40 mg total) by mouth daily. 90 tablet 3   No current facility-administered medications for this visit.     Past Medical History:  Diagnosis Date  . Bronchitis   . Cancer [facility])    Breast cancer  . Chicken pox   . GERD (gastroesophageal reflux disease)   . Hyperlipidemia   . Hypertension   .  Thyroid condition     ROS:   All systems reviewed and negative except as noted in the HPI.   Past Surgical History:  Procedure Laterality Date  . ABDOMINAL HYSTERECTOMY  1989  . APPENDECTOMY  1975  . BREAST SURGERY  Breast lift  . CHOLECYSTECTOMY  1975  . SHOULDER SURGERY Left 2005     Family History  Problem Relation Age of Onset  . Cancer Father        lung  . Hyperlipidemia Father   . Hypertension Father   . Hyperlipidemia Mother   . Hypertension Mother   . Heart disease Brother        Stents     Social History   Socioeconomic History  . Marital status: Single    Spouse name: Not on file  . Number of children: 3   . Years of education: 9  . Highest education level: Not on file  Occupational History  . Occupation: Retired  Tobacco Use  . Smoking status: Former Research scientist (life sciences)  . Smokeless tobacco: Never Used  Vaping Use  . Vaping Use: Never used  Substance and Sexual Activity  . Alcohol use: No  .  Drug use: No  . Sexual activity: Not Currently  Other Topics Concern  . Not on file  Social History Narrative  . Not on file   Social Determinants of Health   Financial Resource Strain:   . Difficulty of Paying Living Expenses: Not on file  Food Insecurity:   . Worried About Charity fundraiser in the Last Year: Not on file  . Ran Out of Food in the Last Year: Not on file  Transportation Needs:   . Lack of Transportation (Medical): Not on file  . Lack of Transportation (Non-Medical): Not on file  Physical Activity:   . Days of Exercise per Week: Not on file  . Minutes of Exercise per Session: Not on file  Stress:   . Feeling of Stress : Not on file  Social Connections:   . Frequency of Communication with Friends and Family: Not on file  . Frequency of Social Gatherings with Friends and Family: Not on file  . Attends Religious Services: Not on file  . Active Member of Clubs or Organizations: Not on file  . Attends Archivist Meetings: Not on file    . Marital Status: Not on file  Intimate Partner Violence:   . Fear of Current or Ex-Partner: Not on file  . Emotionally Abused: Not on file  . Physically Abused: Not on file  . Sexually Abused: Not on file     BP 140/78   Pulse 81   Ht 5' (1.524 m)   Wt 218 lb (98.9 kg)   LMP 06/17/1987   SpO2 95%   BMI 42.58 kg/m   Physical Exam:  Well appearing NAD HEENT: Unremarkable Neck:  6 cm JVD, no thyromegally Lymphatics:  No adenopathy Back:  No CVA tenderness Lungs:  Clear with no wheezes HEART:  IRegular rate rhythm, no murmurs, no rubs, no clicks Abd:  soft, positive bowel sounds, no organomegally, no rebound, no guarding Ext:  2 plus pulses, no edema, no cyanosis, no clubbing Skin:  No rashes no nodules Neuro:  CN II through XII intact, motor grossly intact  EKG - atrial fib with a controlled VR   Assess/Plan: 1. Persistent atrial fib - her rates are controlled. She will continue cardizem. 2. Obesity - we discussed various strategies to lose weight. I have asked her to fast for 18 hours a day. 3. Lymphedema - She will continue her lasix. I asked that she take her twice daily lasix in early a.m and early afternoon. 4. Dyspnea - I asked her to lose weight and eat less sodium.   Cheryl Overlie Omaree Fuqua,MD

## 2020-03-01 NOTE — Patient Instructions (Addendum)
Medication Instructions:  Your physician recommends that you continue on your current medications as directed. Please refer to the Current Medication list given to you today.  Late entry-per OV note dictated by Dr. Florence Canner lasix 40 mg BID PO.  Sent to pharmacy.  Labwork: None ordered.  Testing/Procedures: None ordered.  Follow-Up: Your physician wants you to follow-up in: one year with Dr. Lovena Le.   You will receive a reminder letter in the mail two months in advance. If you don't receive a letter, please call our office to schedule the follow-up appointment.   Any Other Special Instructions Will Be Listed Below (If Applicable).  If you need a refill on your cardiac medications before your next appointment, please call your pharmacy.

## 2020-03-02 MED ORDER — FUROSEMIDE 40 MG PO TABS: 40.0000 mg | ORAL_TABLET | Freq: Two times a day (BID) | ORAL | 3 refills | Status: DC

## 2020-03-02 NOTE — Addendum Note (Signed)
Addended by: Willeen Cass A on: 03/02/2020 07:56 AM   Modules accepted: Orders

## 2020-09-10 ENCOUNTER — Other Ambulatory Visit: Payer: Self-pay | Admitting: Internal Medicine

## 2020-09-11 MED ORDER — DILTIAZEM HCL ER COATED BEADS 300 MG PO CP24: 300.0000 mg | ORAL_CAPSULE | Freq: Every day | ORAL | 1 refills | Status: DC

## 2020-10-19 ENCOUNTER — Encounter: Payer: Self-pay | Admitting: Internal Medicine

## 2020-10-19 ENCOUNTER — Other Ambulatory Visit: Payer: Self-pay

## 2020-10-19 ENCOUNTER — Ambulatory Visit: Payer: Medicare HMO | Admitting: Internal Medicine

## 2020-10-19 VITALS — BP 136/72 | HR 73 | Ht 60.0 in | Wt 220.2 lb

## 2020-10-19 DIAGNOSIS — I480 Paroxysmal atrial fibrillation: Secondary | ICD-10-CM

## 2020-10-19 DIAGNOSIS — I5032 Chronic diastolic (congestive) heart failure: Secondary | ICD-10-CM

## 2020-10-19 NOTE — Progress Notes (Signed)
HPI Cheryl Gray returns today for followup. She is a pleasant 75 yo woman with persistent atrial fib, obesity and colon CA. She has been bothered by peripheral edema. She has not lost weight. She admits to dietary indiscretion. her atrial fib has been rate controlled on cardizem. She has persistent leg swelling thought due to lymphedema. She has atypical chest pain and had a negative stress test 3 years ago. Her EF is normal.  Allergies  Allergen Reactions  . Codeine     hallucinations.  . Other Other (See Comments)    Blisters skin if on too long ;  [place]   . Latex Rash    Other  . Tape Rash and Other (See Comments)    Blisters skin if on too long      Current Outpatient Medications  Medication Sig Dispense Refill  . acetaminophen (TYLENOL) 500 MG tablet Take 500 mg by mouth every 6 (six) hours as needed for mild pain, moderate pain or headache.    . albuterol (VENTOLIN HFA) 108 (90 Base) MCG/ACT inhaler Inhale 1 puff into the lungs as needed for shortness of breath.    . anastrozole (ARIMIDEX) 1 MG tablet Take 1 mg by mouth daily.    Marland Kitchen apixaban (ELIQUIS) 5 MG TABS tablet Take 5 mg by mouth 2 (two) times daily.    Marland Kitchen atorvastatin (LIPITOR) 10 MG tablet Take 10 mg by mouth daily.    Marland Kitchen diltiazem (CARDIZEM CD) 300 MG 24 hr capsule Take 1 capsule (300 mg total) by mouth daily. 90 capsule 1  . lisinopril (ZESTRIL) 5 MG tablet Take 5 mg by mouth daily.    . metFORMIN (GLUMETZA) 500 MG (MOD) 24 hr tablet Take 1,000 mg by mouth daily with breakfast.    . pantoprazole (PROTONIX) 40 MG tablet Take 1 tablet (40 mg total) by mouth daily. 30 tablet 3  . potassium chloride (KLOR-CON) 10 MEQ tablet Take 10 mEq by mouth as needed.    . vitamin B-12 (CYANOCOBALAMIN) 1000 MCG tablet Take 1,000 mcg by mouth daily.    . furosemide (LASIX) 40 MG tablet Take 1 tablet (40 mg total) by mouth 2 (two) times daily. 180 tablet 3   No current facility-administered medications  for this visit.     Past Medical History:  Diagnosis Date  . Bronchitis   . Cancer [facility])    Breast cancer  . Chicken pox   . GERD (gastroesophageal reflux disease)   . Hyperlipidemia   . Hypertension   . Thyroid condition     ROS:   All systems reviewed and negative except as noted in the HPI.   Past Surgical History:  Procedure Laterality Date  . ABDOMINAL HYSTERECTOMY  1989  . APPENDECTOMY  1975  . BREAST SURGERY  Breast lift  . CHOLECYSTECTOMY  1975  . SHOULDER SURGERY Left 2005     Family History  Problem Relation Age of Onset  . Cancer Father        lung  . Hyperlipidemia Father   . Hypertension Father   . Hyperlipidemia Mother   . Hypertension Mother   . Heart disease Brother        Stents     Social History   Socioeconomic History  . Marital status: Single    Spouse name: Not on file  . Number of children: 3   . Years of education: 9  . Highest education level: Not on file  Occupational  History  . Occupation: Retired  Tobacco Use  . Smoking status: Former Research scientist (life sciences)  . Smokeless tobacco: Never Used  Vaping Use  . Vaping Use: Never used  Substance and Sexual Activity  . Alcohol use: No  . Drug use: No  . Sexual activity: Not Currently  Other Topics Concern  . Not on file  Social History Narrative  . Not on file   Social Determinants of Health   Financial Resource Strain: Not on file  Food Insecurity: Not on file  Transportation Needs: Not on file  Physical Activity: Not on file  Stress: Not on file  Social Connections: Not on file  Intimate Partner Violence: Not on file     BP 136/72   Pulse 73   Ht 5' (1.524 m)   Wt 220 lb 3.2 oz (99.9 kg)   LMP 06/17/1987   SpO2 96%   BMI 43.00 kg/m   Physical Exam:  Chronically ill appearing NAD HEENT: Unremarkable Neck:  No JVD, no thyromegally Lymphatics:  No adenopathy Back:  No CVA tenderness Lungs:  Clear with no wheezes HEART:  IRegular rate rhythm, no murmurs, no rubs, no  clicks Abd:  soft, positive bowel sounds, no organomegally, no rebound, no guarding Ext:  2 plus pulses, no edema, no cyanosis, no clubbing Skin:  No rashes no nodules Neuro:  CN II through XII intact, motor grossly intact  EKG - atrial fib with a controlled VR   Assess/Plan: 1. Persistent atrial fib - her rates are controlled. She will continue cardizem.  2. Obesity - we discussed various strategies to lose weight.  3. Lymphedema - She will continue her lasix. I asked that she take her twice daily lasix in early a.m and early afternoon. I asked her to purchase some support stockings.  4. Dyspnea - I asked her to lose weight and eat less sodium.   Cheryl Overlie Cherrill Scrima,MD

## 2020-10-19 NOTE — Patient Instructions (Addendum)
Medication Instructions:  Your physician recommends that you continue on your current medications as directed. Please refer to the Current Medication list given to you today.  Labwork: None ordered.  Testing/Procedures: None ordered.  Follow-Up: Your physician wants you to follow-up in: one year with Cristopher Peru, MD or one of the following Advanced Practice Providers on your designated Care Team:    Chanetta Marshall, NP  Tommye Standard, PA-C  Legrand Como "Jonni Sanger" Chalmers Cater, [place]   Any Other Special Instructions Will Be Listed Below (If Applicable).  If you need a refill on your cardiac medications before your next appointment, please call your pharmacy.   [place] [facility] [place] [phone]

## 2021-01-04 ENCOUNTER — Other Ambulatory Visit (HOSPITAL_BASED_OUTPATIENT_CLINIC_OR_DEPARTMENT_OTHER): Payer: Self-pay | Admitting: *Deleted

## 2021-01-04 ENCOUNTER — Telehealth (HOSPITAL_BASED_OUTPATIENT_CLINIC_OR_DEPARTMENT_OTHER): Payer: Self-pay | Admitting: *Deleted

## 2021-01-04 NOTE — Telephone Encounter (Signed)
   [facility] Pre-operative Risk Assessment    Patient Name: Cheryl Gray  DOB: Jul 19, 1945 MRN: [id number]  HEARTCARE STAFF:  - IMPORTANT!!!!!! Under Visit Info/Reason for Call, type in Other and utilize the format Clearance MM/DD/YY or Clearance TBD. Do not use dashes or single digits. - Please review there is not already an duplicate clearance open for this procedure. - If request is for dental extraction, please clarify the # of teeth to be extracted. - If the patient is currently at the dentist's office, call Pre-Op Callback Staff (MA/nurse) to input urgent request.  - If the patient is not currently in the dentist office, please route to the Pre-Op pool.  Request for surgical clearance:  What type of surgery is being performed?  COLONOSCOPY  When is this surgery scheduled?  TBD  What type of clearance is required (medical clearance vs. Pharmacy clearance to hold med vs. Both)?  BOTH  Are there any medications that need to be held prior to surgery and how long?  ELIQUIS X'S 2 DAYS   Practice name and name of physician performing surgery?  [facility]  What is the office phone number?  [phone]   7.   What is the office fax number?  [phone]  8.   Anesthesia type (None, local, MAC, general) ?  Jeanann Lewandowsky 01/04/2021, 1:17 PM  _________________________________________________________________   (provider comments below)

## 2021-01-04 NOTE — Progress Notes (Signed)
error 

## 2021-01-08 MED ORDER — APIXABAN 5 MG PO TABS: 5.0000 mg | ORAL_TABLET | Freq: Two times a day (BID) | ORAL | 5 refills | Status: AC

## 2021-01-08 NOTE — Addendum Note (Signed)
Addended by: Lily Velasquez E on: 01/08/2021 12:52 PM   Modules accepted: Orders

## 2021-01-08 NOTE — Telephone Encounter (Signed)
Patient with diagnosis of A Fib on Eliquis for anticoagulation.    Procedure: colonoscopy Date of procedure: TBD   CHA2DS2-VASc Score = 5  This indicates a 7.2% annual risk of stroke. The patient's score is based upon: CHF History: Yes HTN History: Yes Diabetes History: Yes Stroke History: No Vascular Disease History: No Age Score: 1 Gender Score: 1    CrCl 65 mL/min using adjusted body weight Platelet count 254K  Per office protocol, patient can hold Eliquis for 2 days prior to procedure.

## 2021-01-08 NOTE — Telephone Encounter (Signed)
   Primary Cardiologist: Cristopher Peru, MD  Chart reviewed as part of pre-operative protocol coverage. Given past medical history and time since last visit, based on ACC/AHA guidelines, Cheryl Gray would be at acceptable risk for the planned procedure without further cardiovascular testing.   Patient with diagnosis of A Fib on Eliquis for anticoagulation.     Procedure: colonoscopy Date of procedure: TBD     CHA2DS2-VASc Score = 5  This indicates a 7.2% annual risk of stroke. The patient's score is based upon: CHF History: Yes HTN History: Yes Diabetes History: Yes Stroke History: No Vascular Disease History: No Age Score: 1 Gender Score: 1      CrCl 65 mL/min using adjusted body weight Platelet count 254K   Per office protocol, patient can hold Eliquis for 2 days prior to procedure.   I will route this recommendation to the requesting party via Epic fax function and remove from pre-op pool.  Please call with questions.  [name] NP-C    01/08/2021, 11:12 AM [place] [address] Office [phone] Fax [phone]

## 2021-03-11 ENCOUNTER — Other Ambulatory Visit: Payer: Self-pay | Admitting: Internal Medicine

## 2021-11-08 ENCOUNTER — Ambulatory Visit: Payer: Medicare HMO | Admitting: Internal Medicine

## 2021-12-10 ENCOUNTER — Other Ambulatory Visit: Payer: Self-pay | Admitting: Internal Medicine

## 2022-01-17 ENCOUNTER — Ambulatory Visit: Payer: Medicare HMO | Admitting: Internal Medicine

## 2022-02-25 ENCOUNTER — Other Ambulatory Visit: Payer: Self-pay | Admitting: Internal Medicine

## 2022-03-24 ENCOUNTER — Other Ambulatory Visit: Payer: Self-pay | Admitting: Internal Medicine

## 2022-03-31 ENCOUNTER — Ambulatory Visit: Payer: Medicare HMO | Admitting: Internal Medicine

## 2022-04-04 ENCOUNTER — Ambulatory Visit: Payer: Medicare HMO | Attending: Internal Medicine | Admitting: Internal Medicine

## 2022-04-04 ENCOUNTER — Encounter: Payer: Self-pay | Admitting: Internal Medicine

## 2022-04-04 VITALS — BP 148/82 | HR 84 | Ht 60.0 in | Wt 217.0 lb

## 2022-04-04 DIAGNOSIS — M79604 Pain in right leg: Secondary | ICD-10-CM

## 2022-04-04 DIAGNOSIS — I480 Paroxysmal atrial fibrillation: Secondary | ICD-10-CM | POA: Diagnosis not present

## 2022-04-04 NOTE — Patient Instructions (Addendum)
Medication Instructions:  Your physician recommends that you continue on your current medications as directed. Please refer to the Current Medication list given to you today.  *If you need a refill on your cardiac medications before your next appointment, please call your pharmacy*   Lab Work: None ordered   Testing/Procedures: Your physician has requested that you have a lower or upper extremity arterial duplex. This test is an ultrasound of the arteries in the legs or arms. It looks at arterial blood flow in the legs and arms. Allow one hour for Lower and Upper Arterial scans. There are no restrictions or special instructions   Follow-Up: At [facility], you and your health needs are our priority.  As part of our continuing mission to provide you with exceptional heart care, we have created designated Provider Care Teams.  These Care Teams include your primary Cardiologist (physician) and Advanced Practice Providers (APPs -  Physician Assistants and Nurse Practitioners) who all work together to provide you with the care you need, when you need it.  We recommend signing up for the patient portal called "MyChart".  Sign up information is provided on this After Visit Summary.  MyChart is used to connect with patients for Virtual Visits (Telemedicine).  Patients are able to view lab/test results, encounter notes, upcoming appointments, etc.  Non-urgent messages can be sent to your provider as well.   To learn more about what you can do with MyChart, go to [email or web address].    Your next appointment:   1 year(s)  The format for your next appointment:   In Person  Provider:   Cristopher Peru, MD    Thank you for choosing [facility]!!   Trinidad Curet, RN [phone]  Other Instructions   Important Information About Sugar

## 2022-04-04 NOTE — Progress Notes (Signed)
HPI Cheryl Gray returns today for followup. She is a pleasant 76 yo woman with persistent atrial fib, obesity and colon CA. She has been bothered by peripheral edema. She has not lost weight. She admits to dietary indiscretion. her atrial fib has been rate controlled on cardizem. She has persistent leg swelling thought due to lymphedema. She has atypical chest pain and had a negative stress test 3 years ago. Her EF is normal.  Allergies  Allergen Reactions   Codeine     hallucinations.   Other Other (See Comments)    Blisters skin if on too long ;  PATIENT STATES IS THE TEGADERM    Latex Rash    Other   Tape Rash and Other (See Comments)    Blisters skin if on too long      Current Outpatient Medications  Medication Sig Dispense Refill   acetaminophen (TYLENOL) 500 MG tablet Take 500 mg by mouth every 6 (six) hours as needed for mild pain, moderate pain or headache.     albuterol (VENTOLIN HFA) 108 (90 Base) MCG/ACT inhaler Inhale 1 puff into the lungs as needed for shortness of breath.     anastrozole (ARIMIDEX) 1 MG tablet Take 1 mg by mouth daily.     apixaban (ELIQUIS) 5 MG TABS tablet Take 1 tablet (5 mg total) by mouth 2 (two) times daily. 60 tablet 5   atorvastatin (LIPITOR) 10 MG tablet Take 10 mg by mouth daily.     diltiazem (CARDIZEM CD) 300 MG 24 hr capsule TAKE 1 CAPSULE BY MOUTH ONCE DAILY KEEP UPCOMING APPOINTMENT FOR FUTURE REFILLS 30 capsule 0   furosemide (LASIX) 80 MG tablet Take 80 mg by mouth 2 (two) times daily.     glipiZIDE (GLUCOTROL XL) 5 MG 24 hr tablet Take 5 mg by mouth daily.     lisinopril (ZESTRIL) 5 MG tablet Take 5 mg by mouth daily.     metFORMIN (GLUCOPHAGE-XR) 500 MG 24 hr tablet Take 1,000 mg by mouth every morning.     Multiple Vitamins-Minerals (PRESERVISION AREDS PO) Take by mouth.     nystatin (MYCOSTATIN/NYSTOP) powder SMARTSIG:Packet(s) Topical 3 Times Daily     OZEMPIC, 0.25 OR 0.5 MG/DOSE, 2 MG/3ML SOPN Inject 0.25 mg into the  skin once a week.     pantoprazole (PROTONIX) 40 MG tablet Take 1 tablet (40 mg total) by mouth daily. 30 tablet 3   potassium chloride (KLOR-CON) 10 MEQ tablet Take 10 mEq by mouth as needed.     No current facility-administered medications for this visit.     Past Medical History:  Diagnosis Date   Bronchitis    Cancer ([place])    Breast cancer   Chicken pox    GERD (gastroesophageal reflux disease)    Hyperlipidemia    Hypertension    Thyroid condition     ROS:   All systems reviewed and negative except as noted in the HPI.   Past Surgical History:  Procedure Laterality Date   ABDOMINAL HYSTERECTOMY  1989   APPENDECTOMY  1975   BREAST SURGERY  Breast lift   CHOLECYSTECTOMY  1975   SHOULDER SURGERY Left 2005     Family History  Problem Relation Age of Onset   Cancer Father        lung   Hyperlipidemia Father    Hypertension Father    Hyperlipidemia Mother    Hypertension Mother    Heart disease Brother  Stents     Social History   Socioeconomic History   Marital status: Single    Spouse name: Not on file   Number of children: 3    Years of education: 9   Highest education level: Not on file  Occupational History   Occupation: Retired  Tobacco Use   Smoking status: Former   Smokeless tobacco: Never  Scientific laboratory technician Use: Never used  Substance and Sexual Activity   Alcohol use: No   Drug use: No   Sexual activity: Not Currently  Other Topics Concern   Not on file  Social History Narrative   Not on file   Social Determinants of Health   Financial Resource Strain: Not on file  Food Insecurity: Not on file  Transportation Needs: Not on file  Physical Activity: Not on file  Stress: Not on file  Social Connections: Not on file  Intimate Partner Violence: Not on file     BP (!) 148/82   Pulse 84   Ht 5' (1.524 m)   Wt 217 lb (98.4 kg)   LMP 06/17/1987   SpO2 97%   BMI 42.38 kg/m   Physical Exam:  obese appearing  NAD HEENT: Unremarkable Neck:  No JVD, no thyromegally Lymphatics:  No adenopathy Back:  No CVA tenderness Lungs:  Clear HEART:  Regular rate rhythm, no murmurs, no rubs, no clicks Abd:  soft, positive bowel sounds, no organomegally, no rebound, no guarding Ext:  2 plus pulses, 3+ edema, no cyanosis, no clubbing Skin:  No rashes no nodules Neuro:  CN II through XII intact, motor grossly intact  EKG - atrial fib with a controlled VR   Assess/Plan:   Persistent atrial fib - her rates appear controlled. She will continue cardizem.   2. Obesity - we discussed various strategies to lose weight.   3. Lymphedema - She will continue her lasix. I asked that she take her twice daily lasix in early a.m and early afternoon. I asked her to purchase some support stockings.   4. Dyspnea - I asked her to lose weight and eat less sodium.   5. Right leg pain with exertion - we will check arterial dopplers to rule out arterial occlusive disease. She has a h/o tobacco abuse in remission.   Cheryl Overlie Zymere Patlan,MD

## 2022-04-10 ENCOUNTER — Other Ambulatory Visit (HOSPITAL_COMMUNITY): Payer: Self-pay | Admitting: Internal Medicine

## 2022-04-10 DIAGNOSIS — I739 Peripheral vascular disease, unspecified: Secondary | ICD-10-CM

## 2022-04-11 ENCOUNTER — Ambulatory Visit (HOSPITAL_COMMUNITY)
Admission: RE | Admit: 2022-04-11 | Discharge: 2022-04-11 | Disposition: A | Discharge status: Home / Self Care | Payer: Medicare HMO | Source: Ambulatory Visit | Attending: Internal Medicine | Admitting: Internal Medicine

## 2022-04-11 DIAGNOSIS — I739 Peripheral vascular disease, unspecified: Secondary | ICD-10-CM | POA: Diagnosis present

## 2022-04-11 DIAGNOSIS — M79604 Pain in right leg: Secondary | ICD-10-CM | POA: Diagnosis present

## 2022-04-14 ENCOUNTER — Other Ambulatory Visit: Payer: Self-pay

## 2022-04-14 MED ORDER — DILTIAZEM HCL ER COATED BEADS 300 MG PO CP24: 300.0000 mg | ORAL_CAPSULE | Freq: Every day | ORAL | 3 refills | Status: DC

## 2023-04-07 ENCOUNTER — Ambulatory Visit: Payer: Medicare HMO | Attending: Internal Medicine | Admitting: Internal Medicine

## 2023-04-07 ENCOUNTER — Encounter: Payer: Self-pay | Admitting: Internal Medicine

## 2023-04-07 VITALS — BP 140/80 | HR 100 | Ht 60.0 in | Wt 206.0 lb

## 2023-04-07 DIAGNOSIS — I480 Paroxysmal atrial fibrillation: Secondary | ICD-10-CM | POA: Diagnosis not present

## 2023-04-07 DIAGNOSIS — I739 Peripheral vascular disease, unspecified: Secondary | ICD-10-CM | POA: Diagnosis not present

## 2023-04-07 MED ORDER — DIGOXIN 125 MCG PO TABS: ORAL_TABLET | ORAL | 3 refills | Status: DC

## 2023-04-07 MED ORDER — DILTIAZEM HCL ER COATED BEADS 300 MG PO CP24: 300.0000 mg | ORAL_CAPSULE | Freq: Every day | ORAL | 3 refills | Status: DC

## 2023-04-07 NOTE — Patient Instructions (Addendum)
Medication Instructions:  Your physician has recommended you make the following change in your medication:  Start digoxin 0.125 daily Monday - Thursday. Do NOT take on Friday, Saturday or Sunday.  Lab Work: Digoxin level in 3 or 4 weeks. Est. Time Nov 18th.  If you have labs (blood work) drawn today and your tests are completely normal, you will receive your results only by: MyChart Message (if you have MyChart) OR A paper copy in the mail If you have any lab test that is abnormal or we need to change your treatment, we will call you to review the results.  Testing/Procedures: None ordered.  Follow-Up: At [facility], you and your health needs are our priority.  As part of our continuing mission to provide you with exceptional heart care, we have created designated Provider Care Teams.  These Care Teams include your primary Cardiologist (physician) and Advanced Practice Providers (APPs -  Physician Assistants and Nurse Practitioners) who all work together to provide you with the care you need, when you need it.  We recommend signing up for the patient portal called "MyChart".  Sign up information is provided on this After Visit Summary.  MyChart is used to connect with patients for Virtual Visits (Telemedicine).  Patients are able to view lab/test results, encounter notes, upcoming appointments, etc.  Non-urgent messages can be sent to your provider as well.   To learn more about what you can do with MyChart, go to [email or web address].    Your next appointment:   1 year(s)  The format for your next appointment:   In Person  Provider:   Lewayne Bunting, MD{or one of the following Advanced Practice Providers on your designated Care Team:   Francis Dowse, [place] Casimiro Needle "Mardelle Matte" [place], [place] Earnest Rosier, NP   Important Information About Sugar

## 2023-04-07 NOTE — Progress Notes (Signed)
HPI Mrs. Cheryl Gray returns today for followup. She is a pleasant 77 yo woman with persistent atrial fib, obesity and colon CA. She has been bothered by peripheral edema. She has not lost weight. She admits to dietary indiscretion. her atrial fib has been rate controlled on cardizem. She has persistent leg swelling thought due to lymphedema. She has atypical chest pain and had a negative stress test 3 years ago. Her EF is normal.   Allergies  Allergen Reactions   Codeine     hallucinations.   Other Other (See Comments)    Blisters skin if on too long ;  PATIENT STATES IS THE TEGADERM    Latex Rash    Other   Tape Rash and Other (See Comments)    Blisters skin if on too long      Current Outpatient Medications  Medication Sig Dispense Refill   acetaminophen (TYLENOL) 500 MG tablet Take 500 mg by mouth every 6 (six) hours as needed for mild pain, moderate pain or headache.     albuterol (VENTOLIN HFA) 108 (90 Base) MCG/ACT inhaler Inhale 1 puff into the lungs as needed for shortness of breath.     anastrozole (ARIMIDEX) 1 MG tablet Take 1 mg by mouth daily.     apixaban (ELIQUIS) 5 MG TABS tablet Take 1 tablet (5 mg total) by mouth 2 (two) times daily. 60 tablet 5   atorvastatin (LIPITOR) 10 MG tablet Take 10 mg by mouth daily.     diltiazem (CARDIZEM CD) 300 MG 24 hr capsule Take 1 capsule (300 mg total) by mouth daily. 90 capsule 3   furosemide (LASIX) 80 MG tablet Take 80 mg by mouth 2 (two) times daily.     glipiZIDE (GLUCOTROL XL) 5 MG 24 hr tablet Take 5 mg by mouth daily.     lisinopril (ZESTRIL) 5 MG tablet Take 10 mg by mouth daily.     metFORMIN (GLUCOPHAGE-XR) 500 MG 24 hr tablet Take 1,000 mg by mouth every morning.     Multiple Vitamins-Minerals (PRESERVISION AREDS PO) Take by mouth.     OZEMPIC, 0.25 OR 0.5 MG/DOSE, 2 MG/3ML SOPN Inject 0.25 mg into the skin once a week.     pantoprazole (PROTONIX) 40 MG tablet Take 1 tablet (40 mg total) by mouth daily. 30 tablet 3    potassium chloride (KLOR-CON) 10 MEQ tablet Take 10 mEq by mouth as needed.     nystatin (MYCOSTATIN/NYSTOP) powder SMARTSIG:Packet(s) Topical 3 Times Daily (Patient not taking: Reported on 04/07/2023)     No current facility-administered medications for this visit.     Past Medical History:  Diagnosis Date   Bronchitis    Cancer ([facility])    Breast cancer   Chicken pox    GERD (gastroesophageal reflux disease)    Hyperlipidemia    Hypertension    Thyroid condition     ROS:   All systems reviewed and negative except as noted in the HPI.   Past Surgical History:  Procedure Laterality Date   ABDOMINAL HYSTERECTOMY  1989   APPENDECTOMY  1975   BREAST SURGERY  Breast lift   CHOLECYSTECTOMY  1975   SHOULDER SURGERY Left 2005     Family History  Problem Relation Age of Onset   Cancer Father        lung   Hyperlipidemia Father    Hypertension Father    Hyperlipidemia Mother    Hypertension Mother    Heart disease Brother  Stents     Social History   Socioeconomic History   Marital status: Single    Spouse name: Not on file   Number of children: 3    Years of education: 9   Highest education level: Not on file  Occupational History   Occupation: Retired  Tobacco Use   Smoking status: Former   Smokeless tobacco: Never  Building services engineer status: Never Used  Substance and Sexual Activity   Alcohol use: No   Drug use: No   Sexual activity: Not Currently  Other Topics Concern   Not on file  Social History Narrative   Not on file   Social Determinants of Health   Financial Resource Strain: Not on file  Food Insecurity: Not on file  Transportation Needs: Not on file  Physical Activity: Not on file  Stress: Not on file  Social Connections: Not on file  Intimate Partner Violence: Not on file     BP (!) 140/80   Pulse 100   Ht 5' (1.524 m)   Wt 206 lb (93.4 kg)   LMP 06/17/1987   SpO2 96%   BMI 40.23 kg/m   Physical Exam:  Well  appearing NAD HEENT: Unremarkable Neck:  No JVD, no thyromegally Lymphatics:  No adenopathy Back:  No CVA tenderness Lungs:  Clear with no wheezes HEART:  Regular rate rhythm, no murmurs, no rubs, no clicks Abd:  soft, positive bowel sounds, no organomegally, no rebound, no guarding Ext:  2 plus pulses, no edema, no cyanosis, no clubbing Skin:  No rashes no nodules Neuro:  CN II through XII intact, motor grossly intact  EKG - atrial fib with a controlled VR.  DEVICE  Normal device function.  See PaceArt for details.   Assess/Plan:  Persistent atrial fib - her rates appear controlled. She will continue cardizem.   2. Obesity - we discussed various strategies to lose weight.   3. Lymphedema - She will continue her lasix. I asked that she take her twice daily lasix in early a.m and early afternoon. I asked her to purchase some support stockings. I also asked her to raise the foot of her bed.   4. Dyspnea - I asked her to lose weight and eat less sodium.    Cheryl Gowda Yosgar Demirjian,MD

## 2023-04-28 ENCOUNTER — Ambulatory Visit: Payer: Medicare HMO

## 2023-04-30 LAB — DIGOXIN LEVEL

## 2023-05-08 ENCOUNTER — Ambulatory Visit: Payer: Medicare HMO | Attending: Internal Medicine

## 2023-05-08 ENCOUNTER — Other Ambulatory Visit: Payer: Self-pay | Admitting: *Deleted

## 2023-05-08 DIAGNOSIS — I480 Paroxysmal atrial fibrillation: Secondary | ICD-10-CM

## 2023-05-08 DIAGNOSIS — Z5181 Encounter for therapeutic drug level monitoring: Secondary | ICD-10-CM

## 2023-05-18 LAB — DIGOXIN LEVEL

## 2024-03-16 ENCOUNTER — Other Ambulatory Visit: Payer: Self-pay | Admitting: Internal Medicine

## 2024-04-05 ENCOUNTER — Other Ambulatory Visit: Payer: Self-pay | Admitting: Internal Medicine

## 2024-04-08 ENCOUNTER — Ambulatory Visit: Admitting: Internal Medicine

## 2024-06-15 ENCOUNTER — Encounter: Payer: Self-pay | Admitting: Internal Medicine

## 2024-06-15 ENCOUNTER — Ambulatory Visit: Admitting: Internal Medicine

## 2024-06-15 VITALS — BP 150/84 | HR 85 | Ht 59.0 in | Wt 192.4 lb

## 2024-06-15 DIAGNOSIS — Z5181 Encounter for therapeutic drug level monitoring: Secondary | ICD-10-CM | POA: Diagnosis not present

## 2024-06-15 DIAGNOSIS — I739 Peripheral vascular disease, unspecified: Secondary | ICD-10-CM | POA: Diagnosis not present

## 2024-06-15 DIAGNOSIS — I480 Paroxysmal atrial fibrillation: Secondary | ICD-10-CM

## 2024-06-15 MED ORDER — DIGOXIN 125 MCG PO TABS: ORAL_TABLET | ORAL | 3 refills | Status: AC

## 2024-06-15 NOTE — Patient Instructions (Signed)
 Medication Instructions:  Your physician recommends that you continue on your current medications as directed. Please refer to the Current Medication list given to you today.  *If you need a refill on your cardiac medications before your next appointment, please call your pharmacy*  Lab Work: None ordered.  You may go to any [facility] Location for your lab work:  [facility] - [address] ([facility]) - [address] [phone] [address]  [place] - [address]   [place]  - [address]   [place] - [address] - [address] [employer]  - [address] [place] [address] ([facility]   If you have labs (blood work) drawn today and your tests are completely normal, you will receive your results only by: [employer] (if you have MyChart)  If you have any lab test that is abnormal or we need to change your treatment, we will call you or send a MyChart message to review the results.  Testing/Procedures: None ordered.  Follow-Up: At [facility], you and your health needs are our priority.  As part of our continuing mission to provide you with exceptional heart care, we have created designated Provider Care Teams.  These Care Teams include your primary Cardiologist (physician) and Advanced Practice Providers (APPs -  Physician Assistants and Nurse Practitioners) who all work together to provide you with the care you need, when you need it.  Your next appointment:   1 year(s)  The format for your next appointment:   In Person  Provider:   [name], MD or one of the following Advanced Practice Providers on your designated Care Team:   Cheryl Gray, [place] Cheryl Gray, [place] Cheryl Barrack, NP  Note: Remote monitoring is used to monitor your Pacemaker/ ICD from home. This monitoring reduces the number of office visits required to check  your device to one time per year. It allows us  to keep an eye on the functioning of your device to ensure it is working properly.

## 2024-06-15 NOTE — Progress Notes (Signed)
 "     HPI Cheryl Gray returns today for followup. She is a pleasant 78 yo woman with persistent atrial fib, obesity and colon CA. She has been bothered by peripheral edema. She admits to dietary indiscretion. Her atrial fib has been rate controlled on cardizem  and digoxin . She has persistent leg swelling thought due to lymphedema. She has atypical chest pain and had a negative stress test 4 years ago. Her EF is normal.  She admits to drinking 64 oz of diet Dr. Nunzio daily. She takes her diuretic 2-3 times a week.   Allergies[1]   Current Outpatient Medications  Medication Sig Dispense Refill   acetaminophen  (TYLENOL ) 500 MG tablet Take 500 mg by mouth every 6 (six) hours as needed for mild pain, moderate pain or headache.     albuterol (VENTOLIN HFA) 108 (90 Base) MCG/ACT inhaler Inhale 1 puff into the lungs as needed for shortness of breath.     anastrozole  (ARIMIDEX ) 1 MG tablet Take 1 mg by mouth daily.     apixaban  (ELIQUIS ) 5 MG TABS tablet Take 1 tablet (5 mg total) by mouth 2 (two) times daily. 60 tablet 5   atorvastatin (LIPITOR) 10 MG tablet Take 10 mg by mouth daily.     CARTIA  XT 300 MG 24 hr capsule Take 1 capsule by mouth once daily 90 capsule 0   digoxin  (LANOXIN ) 0.125 MG tablet TAKE 1 TABLET BY MOUTH ONCE DAILY ON  MONDAY-THURSDAY.  DO  NOT  TAKE  ON  FRIDAY,  SATURDAY,  OR  SUNDAY. 52 tablet 0   furosemide  (LASIX ) 80 MG tablet Take 80 mg by mouth 2 (two) times daily. (Patient taking differently: Take 80 mg by mouth as needed.)     glipiZIDE (GLUCOTROL XL) 5 MG 24 hr tablet Take 5 mg by mouth daily.     lisinopril  (ZESTRIL ) 5 MG tablet Take 10 mg by mouth daily.     metFORMIN (GLUCOPHAGE-XR) 500 MG 24 hr tablet Take 1,000 mg by mouth every morning.     Multiple Vitamins-Minerals (PRESERVISION AREDS PO) Take by mouth.     nystatin (MYCOSTATIN/NYSTOP) powder SMARTSIG:Packet(s) Topical 3 Times Daily     OZEMPIC, 0.25 OR 0.5 MG/DOSE, 2 MG/3ML SOPN Inject 0.25 mg into the skin  once a week.     pantoprazole  (PROTONIX ) 40 MG tablet Take 1 tablet (40 mg total) by mouth daily. 30 tablet 3   potassium chloride (KLOR-CON) 10 MEQ tablet Take 10 mEq by mouth as needed.     No current facility-administered medications for this visit.     Past Medical History:  Diagnosis Date   Bronchitis    Cancer ([facility])    Breast cancer   Chicken pox    GERD (gastroesophageal reflux disease)    Hyperlipidemia    Hypertension    Thyroid  condition     ROS:   All systems reviewed and negative except as noted in the HPI.   Past Surgical History:  Procedure Laterality Date   ABDOMINAL HYSTERECTOMY  1989   APPENDECTOMY  1975   BREAST SURGERY  Breast lift   CHOLECYSTECTOMY  1975   SHOULDER SURGERY Left 2005     Family History  Problem Relation Age of Onset   Cancer Father        lung   Hyperlipidemia Father    Hypertension Father    Hyperlipidemia Mother    Hypertension Mother    Heart disease Brother        Stents  Social History   Socioeconomic History   Marital status: Single    Spouse name: Not on file   Number of children: 3    Years of education: 9   Highest education level: Not on file  Occupational History   Occupation: Retired  Tobacco Use   Smoking status: Former   Smokeless tobacco: Never  Advertising Account Planner   Vaping status: Never Used  Substance and Sexual Activity   Alcohol use: No   Drug use: No   Sexual activity: Not Currently  Other Topics Concern   Not on file  Social History Narrative   Not on file   Social Drivers of Health   Tobacco Use: Medium Risk (06/15/2024)   Patient History    Smoking Tobacco Use: Former    Smokeless Tobacco Use: Never    Passive Exposure: Not on Actuary Strain: Not on file  Food Insecurity: Low Risk (04/23/2023)   Received from [facility]   Epic    Within the past 12 months, you worried that your food would run out before you got money to buy more: Never true    Within the past 12  months, the food you bought just didn't last and you didn't have money to get more. : Never true  Transportation Needs: No Transportation Needs (04/23/2023)   Received from [facility]    In the past 12 months, has lack of reliable transportation kept you from medical appointments, meetings, work or from getting things needed for daily living? : No  Physical Activity: Not on file  Stress: Not on file  Social Connections: Not on file  Intimate Partner Violence: Not on file  Depression (EYV7-0): Not on file  Alcohol Screen: Not on file  Housing: Low Risk (04/23/2023)   Received from [facility]   Epic    What is your living situation today?: I have a steady place to live    Think about the place you live. Do you have problems with any of the following? Choose all that apply:: None/None on this list  Utilities: Low Risk (04/23/2023)   Received from [facility]   Utilities    In the past 12 months has the electric, gas, oil, or water company threatened to shut off services in your home? : No  Health Literacy: Not on file     BP (!) 150/84 (BP Location: Left Arm, Patient Position: Sitting, Cuff Size: Large)   Pulse 85   Ht 4' 11 (1.499 m)   Wt 192 lb 6.4 oz (87.3 kg)   LMP 06/17/1987   SpO2 99%   BMI 38.86 kg/m   Physical Exam:  Well appearing NAD HEENT: Unremarkable Neck:  No JVD, no thyromegally Lymphatics:  No adenopathy Back:  No CVA tenderness Lungs:  Clear HEART:  Regular rate rhythm, no murmurs, no rubs, no clicks Abd:  soft, positive bowel sounds, no organomegally, no rebound, no guarding Ext:  2 plus pulses, no edema, no cyanosis, no clubbing Skin:  No rashes no nodules Neuro:  CN II through XII intact, motor grossly intact  EKG - atrial fib with a controlled VR.   Assess/Plan:  Persistent atrial fib - her rates appear controlled. She will continue cardizem  and dig.   2. Obesity - we discussed various strategies to lose weight. She is  down 15 lbs from her last visit.    3. Lymphedema - She will continue her lasix . I asked that she take her twice  daily lasix  in early a.m and early afternoon. I asked her to purchase some support stockings. I also asked her to raise the foot of her bed.   4. Dyspnea - I asked her to lose weight and eat less sodium.    Cheryl Haidy Kackley,MD    [1]  Allergies Allergen Reactions   Codeine     hallucinations.   Other Other (See Comments)    Blisters skin if on too long ;  PATIENT STATES IS THE TEGADERM    Latex Rash    Other   Tape Rash and Other (See Comments)    Blisters skin if on too long    "

## 2024-06-15 NOTE — Addendum Note (Signed)
 Addended by: Shanique Aslinger N on: 06/15/2024 12:17 PM   Modules accepted: Orders
